# Patient Record
Sex: Female | Born: 1950 | Race: Black or African American | Hispanic: No | Smoking: Current every day smoker
Health system: Southern US, Community
[De-identification: ages and names within clinical notes are randomized; demographics above are authoritative.]

## PROBLEM LIST (undated history)

## (undated) DIAGNOSIS — E119 Type 2 diabetes mellitus without complications: Secondary | ICD-10-CM

## (undated) DIAGNOSIS — I10 Essential (primary) hypertension: Secondary | ICD-10-CM

---

## 2016-05-05 ENCOUNTER — Encounter (HOSPITAL_COMMUNITY): Payer: Self-pay

## 2016-05-05 ENCOUNTER — Observation Stay (HOSPITAL_COMMUNITY)
Admission: EM | Admit: 2016-05-05 | Discharge: 2016-05-06 | Disposition: A | Discharge status: Home / Self Care | Payer: Medicare Other | Attending: Internal Medicine | Admitting: Internal Medicine

## 2016-05-05 ENCOUNTER — Emergency Department (HOSPITAL_COMMUNITY): Payer: Medicare Other

## 2016-05-05 DIAGNOSIS — E119 Type 2 diabetes mellitus without complications: Secondary | ICD-10-CM | POA: Insufficient documentation

## 2016-05-05 DIAGNOSIS — R55 Syncope and collapse: Secondary | ICD-10-CM | POA: Diagnosis not present

## 2016-05-05 DIAGNOSIS — I10 Essential (primary) hypertension: Secondary | ICD-10-CM | POA: Diagnosis not present

## 2016-05-05 DIAGNOSIS — I110 Hypertensive heart disease with heart failure: Secondary | ICD-10-CM | POA: Insufficient documentation

## 2016-05-05 DIAGNOSIS — D649 Anemia, unspecified: Secondary | ICD-10-CM | POA: Diagnosis not present

## 2016-05-05 DIAGNOSIS — I5032 Chronic diastolic (congestive) heart failure: Secondary | ICD-10-CM | POA: Diagnosis not present

## 2016-05-05 DIAGNOSIS — Z79899 Other long term (current) drug therapy: Secondary | ICD-10-CM | POA: Diagnosis not present

## 2016-05-05 DIAGNOSIS — F17210 Nicotine dependence, cigarettes, uncomplicated: Secondary | ICD-10-CM | POA: Insufficient documentation

## 2016-05-05 DIAGNOSIS — I951 Orthostatic hypotension: Principal | ICD-10-CM | POA: Insufficient documentation

## 2016-05-05 DIAGNOSIS — Z794 Long term (current) use of insulin: Secondary | ICD-10-CM | POA: Insufficient documentation

## 2016-05-05 HISTORY — DX: Type 2 diabetes mellitus without complications: E11.9

## 2016-05-05 HISTORY — DX: Essential (primary) hypertension: I10

## 2016-05-05 LAB — URINALYSIS, ROUTINE W REFLEX MICROSCOPIC
Bilirubin Urine: NEGATIVE
Glucose, UA: >=500 mg/dL — AB
HGB URINE DIPSTICK: NEGATIVE
Ketones, ur: NEGATIVE mg/dL
Nitrite: NEGATIVE
Protein, ur: NEGATIVE mg/dL
SPECIFIC GRAVITY, URINE: 1.015 (ref 1.005–1.030)
pH: 6 (ref 5.0–8.0)

## 2016-05-05 LAB — COMPREHENSIVE METABOLIC PANEL
ALBUMIN: 3.7 g/dL (ref 3.5–5.0)
ALK PHOS: 61 U/L (ref 38–126)
ALT: 17 U/L (ref 14–54)
AST: 20 U/L (ref 15–41)
Anion gap: 11 (ref 5–15)
BUN: 18 mg/dL (ref 6–20)
CHLORIDE: 101 mmol/L (ref 101–111)
CO2: 26 mmol/L (ref 22–32)
CREATININE: 0.96 mg/dL (ref 0.44–1.00)
Calcium: 9.2 mg/dL (ref 8.9–10.3)
GFR calc non Af Amer: >60 mL/min (ref >60)
GLUCOSE: 184 mg/dL — AB (ref 65–99)
Potassium: 3.6 mmol/L (ref 3.5–5.1)
SODIUM: 138 mmol/L (ref 135–145)
Total Bilirubin: 0.5 mg/dL (ref 0.3–1.2)
Total Protein: 6.6 g/dL (ref 6.5–8.1)

## 2016-05-05 LAB — CBC WITH DIFFERENTIAL/PLATELET
BASOS ABS: 0 10*3/uL (ref 0.0–0.1)
Basophils Relative: 0 %
EOS ABS: 0.1 10*3/uL (ref 0.0–0.7)
EOS PCT: 1 %
HCT: 34.8 % — ABNORMAL LOW (ref 36.0–46.0)
Hemoglobin: 11.5 g/dL — ABNORMAL LOW (ref 12.0–15.0)
Lymphocytes Relative: 25 %
Lymphs Abs: 2.1 10*3/uL (ref 0.7–4.0)
MCH: 27.2 pg (ref 26.0–34.0)
MCHC: 33 g/dL (ref 30.0–36.0)
MCV: 82.3 fL (ref 78.0–100.0)
MONO ABS: 0.3 10*3/uL (ref 0.1–1.0)
Monocytes Relative: 3 %
Neutro Abs: 6.1 10*3/uL (ref 1.7–7.7)
Neutrophils Relative %: 71 %
PLATELETS: 310 10*3/uL (ref 150–400)
RBC: 4.23 MIL/uL (ref 3.87–5.11)
RDW: 14.7 % (ref 11.5–15.5)
WBC: 8.6 10*3/uL (ref 4.0–10.5)

## 2016-05-05 LAB — GLUCOSE, CAPILLARY: Glucose-Capillary: 188 mg/dL — ABNORMAL HIGH (ref 65–99)

## 2016-05-05 LAB — I-STAT TROPONIN, ED: TROPONIN I, POC: 0 ng/mL (ref 0.00–0.08)

## 2016-05-05 MED ORDER — ACETAMINOPHEN 325 MG PO TABS
650.0000 mg | ORAL_TABLET | Freq: Four times a day (QID) | ORAL | Status: DC | PRN
  Administered 2016-05-05 – 2016-05-06 (×2): 650 mg via ORAL
  Filled 2016-05-05 (×2): qty 2

## 2016-05-05 MED ORDER — ONDANSETRON HCL 4 MG/2ML IJ SOLN: 4.0000 mg | Freq: Four times a day (QID) | INTRAMUSCULAR | Status: DC | PRN

## 2016-05-05 MED ORDER — ENOXAPARIN SODIUM 40 MG/0.4ML ~~LOC~~ SOLN
40.0000 mg | SUBCUTANEOUS | Status: DC
  Administered 2016-05-05: 40 mg via SUBCUTANEOUS
  Filled 2016-05-05 (×2): qty 0.4

## 2016-05-05 MED ORDER — SODIUM CHLORIDE 0.9% FLUSH
3.0000 mL | Freq: Two times a day (BID) | INTRAVENOUS | Status: DC
  Administered 2016-05-05: 3 mL via INTRAVENOUS

## 2016-05-05 MED ORDER — ONDANSETRON HCL 4 MG PO TABS
4.0000 mg | ORAL_TABLET | Freq: Four times a day (QID) | ORAL | Status: DC | PRN
Start: 2016-05-05 — End: 2016-05-06

## 2016-05-05 MED ORDER — INSULIN ASPART 100 UNIT/ML ~~LOC~~ SOLN
0.0000 [IU] | Freq: Three times a day (TID) | SUBCUTANEOUS | Status: DC
  Administered 2016-05-06: 2 [IU] via SUBCUTANEOUS
  Administered 2016-05-06: 3 [IU] via SUBCUTANEOUS
  Administered 2016-05-06: 2 [IU] via SUBCUTANEOUS

## 2016-05-05 MED ORDER — LORATADINE 10 MG PO TABS: 10.0000 mg | ORAL_TABLET | Freq: Every day | ORAL | Status: DC | PRN

## 2016-05-05 MED ORDER — POLYETHYLENE GLYCOL 3350 17 G PO PACK: 17.0000 g | PACK | Freq: Every day | ORAL | Status: DC | PRN

## 2016-05-05 MED ORDER — IBUPROFEN 200 MG PO TABS
600.0000 mg | ORAL_TABLET | Freq: Four times a day (QID) | ORAL | Status: DC | PRN
Start: 2016-05-05 — End: 2016-05-06
  Administered 2016-05-06: 600 mg via ORAL
  Filled 2016-05-05: qty 3

## 2016-05-05 MED ORDER — SODIUM CHLORIDE 0.9% FLUSH: 3.0000 mL | INTRAVENOUS | Status: DC | PRN

## 2016-05-05 MED ORDER — SODIUM CHLORIDE 0.9% FLUSH: 3.0000 mL | Freq: Two times a day (BID) | INTRAVENOUS | Status: DC

## 2016-05-05 MED ORDER — INSULIN ASPART 100 UNIT/ML ~~LOC~~ SOLN: 0.0000 [IU] | Freq: Every day | SUBCUTANEOUS | Status: DC

## 2016-05-05 MED ORDER — SODIUM CHLORIDE 0.9 % IV SOLN: 250.0000 mL | INTRAVENOUS | Status: DC | PRN

## 2016-05-05 MED ORDER — DIPHENHYDRAMINE HCL 25 MG PO CAPS: 25.0000 mg | ORAL_CAPSULE | Freq: Every evening | ORAL | Status: DC | PRN

## 2016-05-05 MED ORDER — SODIUM CHLORIDE 0.9 % IV BOLUS (SEPSIS)
1000.0000 mL | Freq: Once | INTRAVENOUS | Status: AC
  Administered 2016-05-05: 1000 mL via INTRAVENOUS

## 2016-05-05 MED ORDER — ACETAMINOPHEN 650 MG RE SUPP: 650.0000 mg | Freq: Four times a day (QID) | RECTAL | Status: DC | PRN

## 2016-05-05 NOTE — ED Provider Notes (Addendum)
Patient had10 second syncopal event witnessed by her sister. She is presently asymptomatic. On exam no distress lungs clear auscultation heart regular rate and rhythm no murmurs. Neurologic Glasgow Coma Score 15 moves all extremities well cranial nerves II through XII grossly intact not lightheaded on standing. Dr. Antionette Char consulted and will arrange for overnight stay   Doug Sou, MD 05/05/16 2022    Doug Sou, MD 05/05/16 2100    Doug Sou, MD 05/07/16 [phone]

## 2016-05-05 NOTE — ED Provider Notes (Signed)
[facility]-EMERGENCY DEPT Provider Note   CSN: [id number] Arrival date & time: 05/05/16  1801     History   Chief Complaint Chief Complaint  Patient presents with  . Dizziness  . Loss of Consciousness    HPI Tina Davis is a 66 y.o. female.  HPI She presents today after a Syncopal event at home. According to patient's sister patient was unconscious for approximately 10-15 seconds, during which she did not have any jerking movements but she "spat up."  Patient did not fall, did not strike her head, patient's sister moved patient so that she would "not aspirate."  Patient reported feeling a wave of dizziness before she passed out.  Patient does not have a history of syncopal events.  Right before the syncopal event she was sitting down.  She currently feels "a little off" but does not complain of headache, dizziness, nausea/vomiting.  No chest pain, SOB.  Patient reports she has been eating normally today and is compliant with her medications.    Past Medical History:  Diagnosis Date  . Diabetes mellitus without complication (HCC)   . Hypertension     Patient Active Problem List   Diagnosis Date Noted  . Essential hypertension 05/05/2016  . Insulin-requiring or dependent type II diabetes mellitus (HCC) 05/05/2016  . Syncope 05/05/2016  . Type 2 diabetes mellitus without complication (HCC)     History reviewed. No pertinent surgical history.  OB History    No data available       Home Medications    Prior to Admission medications   Medication Sig Start Date End Date Taking? Authorizing Provider  glipiZIDE (GLUCOTROL XL) 5 MG 24 hr tablet Take 5 mg by mouth daily with breakfast.   Yes Historical Provider, MD  loratadine (CLARITIN) 10 MG tablet Take 10 mg by mouth daily as needed for allergies.   Yes Historical Provider, MD  losartan-hydrochlorothiazide (HYZAAR) 100-12.5 MG tablet Take 1 tablet by mouth daily.   Yes Historical Provider, MD  metFORMIN (GLUCOPHAGE) 500 MG  tablet Take 1,000 mg by mouth 2 (two) times daily with a meal.   Yes Historical Provider, MD  Naproxen Sod-Diphenhydramine (ALEVE PM PO) Take 1 tablet by mouth at bedtime as needed (sleep).   Yes Historical Provider, MD    Family History Family History  Problem Relation Age of Onset  . Hypertension Mother   . Diabetes Father   . Diabetes Sister     Social History Social History  Substance Use Topics  . Smoking status: Current Every Day Smoker    Types: Cigarettes  . Smokeless tobacco: Never Used  . Alcohol use No     Allergies   Patient has no known allergies.   Review of Systems Review of Systems  Constitutional: Negative for chills and fever.  HENT: Negative for ear pain and sore throat.   Eyes: Negative for pain and visual disturbance.  Respiratory: Negative for cough, chest tightness and shortness of breath.   Cardiovascular: Negative for chest pain, palpitations and leg swelling.  Gastrointestinal: Negative for abdominal pain and vomiting.  Genitourinary: Negative for dysuria and hematuria.  Musculoskeletal: Negative for arthralgias and back pain.  Skin: Negative for color change and rash.  Neurological: Positive for syncope and light-headedness. Negative for tremors, seizures, facial asymmetry, speech difficulty, weakness and headaches.  All other systems reviewed and are negative.    Physical Exam Updated Vital Signs BP (!) 142/75 (BP Location: Left Arm)   Pulse 85   Temp 98.9 F (37.2  C) (Oral)   Resp 19   Ht  (1.626 m)   Wt 57.2 kg   SpO2 100%   BMI 21.65 kg/m   Physical Exam  Constitutional: She is oriented to person, place, and time. She appears well-developed and well-nourished.  Non-toxic appearance. No distress.  HENT:  Head: Normocephalic and atraumatic.  Right Ear: External ear normal.  Left Ear: External ear normal.  Nose: Nose normal.  Eyes: Conjunctivae and EOM are normal. Pupils are equal, round, and reactive to light. Right eye  exhibits no discharge. Left eye exhibits no discharge. No scleral icterus.  Neck: Normal range of motion. Neck supple. No tracheal deviation present.  Cardiovascular: Normal rate, regular rhythm, normal heart sounds and intact distal pulses.  Exam reveals no gallop and no friction rub.   No murmur heard. Pulmonary/Chest: Effort normal and breath sounds normal. No stridor. No respiratory distress. She has no wheezes. She exhibits no tenderness.  Abdominal: Soft. Bowel sounds are normal. She exhibits no distension. There is no tenderness. There is no guarding.  Musculoskeletal: Normal range of motion. She exhibits no edema, tenderness or deformity.  Neurological: She is alert and oriented to person, place, and time. She has normal strength. She displays no tremor. No cranial nerve deficit or sensory deficit. She exhibits normal muscle tone. GCS eye subscore is 4. GCS verbal subscore is 5. GCS motor subscore is 6.  Skin: Skin is warm and dry. She is not diaphoretic.  Psychiatric: She has a normal mood and affect. Her behavior is normal.  Nursing note and vitals reviewed.    ED Treatments / Results  Labs (all labs ordered are listed, but only abnormal results are displayed) Labs Reviewed  CBC WITH DIFFERENTIAL/PLATELET - Abnormal; Notable for the following:       Result Value   Hemoglobin 11.5 (*)    HCT 34.8 (*)    All other components within normal limits  URINALYSIS, ROUTINE W REFLEX MICROSCOPIC - Abnormal; Notable for the following:    Color, Urine AMBER (*)    APPearance CLOUDY (*)    Glucose, UA >=500 (*)    Leukocytes, UA TRACE (*)    Bacteria, UA RARE (*)    Squamous Epithelial / LPF 0-5 (*)    All other components within normal limits  COMPREHENSIVE METABOLIC PANEL - Abnormal; Notable for the following:    Glucose, Bld 184 (*)    All other components within normal limits  BASIC METABOLIC PANEL - Abnormal; Notable for the following:    Glucose, Bld 101 (*)    Calcium 8.5 (*)      All other components within normal limits  GLUCOSE, CAPILLARY - Abnormal; Notable for the following:    Glucose-Capillary 188 (*)    All other components within normal limits  GLUCOSE, CAPILLARY - Abnormal; Notable for the following:    Glucose-Capillary 124 (*)    All other components within normal limits  GLUCOSE, CAPILLARY - Abnormal; Notable for the following:    Glucose-Capillary 162 (*)    All other components within normal limits  HEMOGLOBIN A1C  I-STAT TROPOININ, ED    EKG  EKG Interpretation  Date/Time:  Tuesday May 05 2016 18:22:16 EDT Ventricular Rate:  84 PR Interval:    QRS Duration: 102 QT Interval:  387 QTC Calculation: 458 R Axis:   69 Text Interpretation:  Sinus rhythm Probable left atrial enlargement No old tracing to compare Confirmed by Ethelda Chick  MD, SAM [id number]) on 05/05/2016 6:31:24 PM  Radiology Dg Chest 2 View  Result Date: 05/05/2016 CLINICAL DATA:  Syncope.  Vomiting. EXAM: CHEST  2 VIEW COMPARISON:  None. FINDINGS: Lungs are adequately inflated without consolidation or effusion. Cardiomediastinal silhouette is within normal. Mild degenerate change of the spine. IMPRESSION: No active cardiopulmonary disease. Electronically Signed   By: Elberta Fortis M.D.   On: 05/05/2016 20:18    Procedures Procedures (including critical care time)  Medications Ordered in ED    Initial Impression / Assessment and Plan / ED Course  I have reviewed the triage vital signs and the nursing notes.  Pertinent labs & imaging results that were available during my care of the patient were reviewed by me and considered in my medical decision making (see chart for details).    Patient without arrhythmia or tachycardia while here in the department.  Patient without history of congestive heart failure, normal ECG, no shortness of breath and systolic blood pressure greater than 90.    At shift change care was transferred to Dr. Ethelda Chick who will follow pending  studies, re-evaulate and determine disposition.      Final Clinical Impressions(s) / ED Diagnoses   Final diagnoses:  Syncope and collapse    New Prescriptions Current Discharge Medication List       Cristina Gong, Cordelia Poche 05/06/16 1234    Doug Sou, MD 05/07/16 [phone]

## 2016-05-05 NOTE — ED Notes (Signed)
Opyd, MD at bedside 

## 2016-05-05 NOTE — ED Notes (Signed)
Pt called out. C/o cramps.

## 2016-05-05 NOTE — H&P (Signed)
History and Physical    Tina Davis ZOX:[id number] DOB: 17-Jun-1950 DOA: 05/05/2016  PCP: No PCP Per Patient   Patient coming from: Home  Chief Complaint: Syncope   HPI: Tina Davis is a 66 y.o. female with medical history significant for hypertension and type 2 diabetes mellitus who presents the emergency department following a syncopal episode at home. Patient reports that she was in her usual state of health and having an uneventful day. She had been out shopping with her sisters and in her normal state after returning home. She was in a chair, conversing with her sisters when she experienced a sudden "wave of nausea and lightheadedness," which was followed by an apparent loss of consciousness. She was noted by her sisters to slump over in the chair and vomit. They turned her to her side to avoid aspiration and reports that she regained consciousness within a few seconds. She reports some nausea and mild malaise after regaining consciousness, but denies any chest pain or palpitations and denies any headache, change in vision or hearing, or focal numbness or weakness. She had never experienced anything similar. She denies any lower extremity swelling or tenderness, prolonged immobilization, or personal or family history of VTE. There has not been any recent fevers or chills and no dysuria or flank pain. No recent abdominal pain or diarrhea.  ED Course: Upon arrival to the ED, patient is found to be saturating well on room air, initial blood pressure 95/69, and vitals otherwise stable. EKG features a normal sinus rhythm and chest x-ray is negative for acute cardiopulmonary disease. Chemistry panel was unremarkable and CBC is notable for a mild normocytic anemia with hemoglobin of 11.5. Troponin is undetectable. Urinalysis has been ordered and remains pending and the patient was given 1 L of normal saline in the emergency department. Blood pressure came up some and has remained stable. The  patient has not been in any apparent distress in the ED and she will be observed on the telemetry unit for ongoing evaluation and management of a syncopal episode.   Review of Systems:  All other systems reviewed and apart from HPI, are negative.  Past Medical History:  Diagnosis Date  . Diabetes mellitus without complication (HCC)   . Hypertension     History reviewed. No pertinent surgical history.   reports that she has been smoking.  She has never used smokeless tobacco. She reports that she does not drink alcohol or use drugs.  No Known Allergies  History reviewed. No pertinent family history.   Prior to Admission medications   Medication Sig Start Date End Date Taking? Authorizing Provider  loratadine (CLARITIN) 10 MG tablet Take 10 mg by mouth daily as needed for allergies.   Yes Historical Provider, MD  metFORMIN (GLUCOPHAGE) 500 MG tablet Take 1,000 mg by mouth 2 (two) times daily with a meal.   Yes Historical Provider, MD  Naproxen Sod-Diphenhydramine (ALEVE PM PO) Take 1 tablet by mouth at bedtime as needed (sleep).   Yes Historical Provider, MD    Physical Exam: Vitals:   05/05/16 1821 05/05/16 1825 05/05/16 2000 05/05/16 2045  BP:   119/71 123/81  Pulse: 86  82 83  Resp: SpO2: 100%  100% 100%  Weight:  57.6 kg (127 lb)    Height:   (1.626 m)        Constitutional: NAD, calm, comfortable Eyes: PERTLA, lids and conjunctivae normal ENMT: Mucous membranes are moist. Posterior pharynx clear of any  exudate or lesions.   Neck: normal, supple, no masses, no thyromegaly Respiratory: clear to auscultation bilaterally, no wheezing, no crackles. Normal respiratory effort.   Cardiovascular: S1 & S2 heard, regular rate and rhythm. No extremity edema. 2+ pedal pulses. No significant JVD. Abdomen: No distension, no tenderness, no masses palpated. Bowel sounds normal.  Musculoskeletal: no clubbing / cyanosis. No joint deformity upper and lower extremities.     Skin: no significant rashes, lesions, ulcers. Warm, dry, well-perfused. Neurologic: CN 2-12 grossly intact. Sensation intact, DTR normal. Strength 5/5 in all 4 limbs.  Psychiatric: Alert and oriented x 3. Normal mood and affect.     Labs on Admission: I have personally reviewed following labs and imaging studies  CBC:  Recent Labs Lab 05/05/16 1931  WBC 8.6  NEUTROABS 6.1  HGB 11.5*  HCT 34.8*  MCV 82.3  PLT 310   Basic Metabolic Panel:  Recent Labs Lab 05/05/16 1931  NA 138  K 3.6  CL 101  CO2 26  GLUCOSE 184*  BUN 18  CREATININE 0.96  CALCIUM 9.2   GFR: Estimated Creatinine Clearance: 49.8 mL/min (by C-G formula based on SCr of 0.96 mg/dL). Liver Function Tests:  Recent Labs Lab 05/05/16 1931  AST 20  ALT 17  ALKPHOS 61  BILITOT 0.5  PROT 6.6  ALBUMIN 3.7   No results for input(s): LIPASE, AMYLASE in the last 168 hours. No results for input(s): AMMONIA in the last 168 hours. Coagulation Profile: No results for input(s): INR, PROTIME in the last 168 hours. Cardiac Enzymes: No results for input(s): CKTOTAL, CKMB, CKMBINDEX, TROPONINI in the last 168 hours. BNP (last 3 results) No results for input(s): PROBNP in the last 8760 hours. HbA1C: No results for input(s): HGBA1C in the last 72 hours. CBG: No results for input(s): GLUCAP in the last 168 hours. Lipid Profile: No results for input(s): CHOL, HDL, LDLCALC, TRIG, CHOLHDL, LDLDIRECT in the last 72 hours. Thyroid Function Tests: No results for input(s): TSH, T4TOTAL, FREET4, T3FREE, THYROIDAB in the last 72 hours. Anemia Panel: No results for input(s): VITAMINB12, FOLATE, FERRITIN, TIBC, IRON, RETICCTPCT in the last 72 hours. Urine analysis: No results found for: COLORURINE, APPEARANCEUR, LABSPEC, PHURINE, GLUCOSEU, HGBUR, BILIRUBINUR, KETONESUR, PROTEINUR, UROBILINOGEN, NITRITE, LEUKOCYTESUR Sepsis Labs: (procalcitonin:4,lacticidven:4) )No results found for this or any previous visit  (from the past 240 hour(s)).   Radiological Exams on Admission: Dg Chest 2 View  Result Date: 05/05/2016 CLINICAL DATA:  Syncope.  Vomiting. EXAM: CHEST  2 VIEW COMPARISON:  None. FINDINGS: Lungs are adequately inflated without consolidation or effusion. Cardiomediastinal silhouette is within normal. Mild degenerate change of the spine. IMPRESSION: No active cardiopulmonary disease. Electronically Signed   By: Elberta Fortis M.D.   On: 05/05/2016 20:18    EKG: Independently reviewed. Normal sinus rhythm.   Assessment/Plan  1. Syncope  - Pt presents after first syncopal episode, occurred while seated with prodromal nausea and lightheadedness, followed immediately by vomiting  - Initial blood pressure soft, but remaining ED workup unremarkable - Suspect a neurally-mediated reflex syncope based on prodrome  - Nothing to suggest seizure or PE - Check orthostatic vitals, monitor on telemetry for transient arrhythmia, exclude structural cardiac etiology with echo   2. Hypertension  - Initial BP soft in ED, at goal and stable since  - Managed at home with losartan-HCTZ, will hold for now - Trend BP, consider discontinuation or dose-reduction   3. Type II DM  - A1c was 10.4% in March 2015 - Managed at home  with metformin and glipizide; currently held  - Check CBG with meals and qHS  - Start a moderate-intensity sliding-scale Novolog correctional    DVT prophylaxis: sq Lovenox  Code Status: Full  Family Communication: Discussed with patient Disposition Plan: Observe on telemetry Consults called: None  Admission status: Observation     Briscoe Deutscher, MD [facility] Pager [phone]  If 7PM-7AM, please contact night-coverage [email or web address] Password [facility]  05/05/2016, 9:34 PM

## 2016-05-05 NOTE — ED Triage Notes (Signed)
Pt brought in by EMS due to having dizziness and syncopal episode. Per EMS pt LOC for approximately 10 seconds and had an episode of vomiting. Foaming at the mouth. Pt a&ox4. Pt received 350cc of NS.

## 2016-05-05 NOTE — ED Notes (Signed)
Patient transported to X-ray 

## 2016-05-06 ENCOUNTER — Observation Stay (HOSPITAL_BASED_OUTPATIENT_CLINIC_OR_DEPARTMENT_OTHER): Payer: Medicare Other

## 2016-05-06 DIAGNOSIS — I10 Essential (primary) hypertension: Secondary | ICD-10-CM

## 2016-05-06 DIAGNOSIS — R55 Syncope and collapse: Secondary | ICD-10-CM

## 2016-05-06 DIAGNOSIS — Z794 Long term (current) use of insulin: Secondary | ICD-10-CM | POA: Diagnosis not present

## 2016-05-06 DIAGNOSIS — E119 Type 2 diabetes mellitus without complications: Secondary | ICD-10-CM | POA: Diagnosis not present

## 2016-05-06 LAB — BASIC METABOLIC PANEL WITH GFR
Anion gap: 6 (ref 5–15)
BUN: 19 mg/dL (ref 6–20)
CO2: 24 mmol/L (ref 22–32)
Calcium: 8.5 mg/dL — ABNORMAL LOW (ref 8.9–10.3)
Chloride: 107 mmol/L (ref 101–111)
Creatinine, Ser: 0.86 mg/dL (ref 0.44–1.00)
GFR calc Af Amer: >60 mL/min
GFR calc non Af Amer: >60 mL/min
Glucose, Bld: 101 mg/dL — ABNORMAL HIGH (ref 65–99)
Potassium: 3.6 mmol/L (ref 3.5–5.1)
Sodium: 137 mmol/L (ref 135–145)

## 2016-05-06 LAB — GLUCOSE, CAPILLARY
GLUCOSE-CAPILLARY: 147 mg/dL — AB (ref 65–99)
Glucose-Capillary: 124 mg/dL — ABNORMAL HIGH (ref 65–99)
Glucose-Capillary: 162 mg/dL — ABNORMAL HIGH (ref 65–99)

## 2016-05-06 LAB — HEMOGLOBIN A1C
HEMOGLOBIN A1C: 9.9 % — AB (ref 4.8–5.6)
MEAN PLASMA GLUCOSE: 237 mg/dL

## 2016-05-06 LAB — ECHOCARDIOGRAM COMPLETE
HEIGHTINCHES: 64 in
Weight: 2018 oz

## 2016-05-06 MED ORDER — IBUPROFEN 600 MG PO TABS: 600.0000 mg | ORAL_TABLET | Freq: Four times a day (QID) | ORAL | 0 refills | Status: DC | PRN

## 2016-05-06 NOTE — Care Management Note (Signed)
Case Management Note  Patient Details  Name: Tina Davis MRN: [id number] Date of Birth: 1950-05-05  Subjective/Objective:   Pt in with syncope. 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 She is IADL.                   Action/Plan: Plan is for patient to return home at d/c. CM consulted for assistance obtaining a PCP. Pt provided with the [facility] number. CM also provided her with 3 number for physicians taking new patients near the [place] area. Pt to call and arrange a first appointment.   Expected Discharge Date:                  Expected Discharge Plan:  Home/Self Care  In-House Referral:     Discharge planning Services  CM Consult  Post Acute Care Choice:    Choice offered to:     DME Arranged:    DME Agency:     HH Arranged:    HH Agency:     Status of Service:  In process, will continue to follow  If discussed at Long Length of Stay Meetings, dates discussed:    Additional Comments:  Kermit Balo, RN 05/06/2016, 12:07 PM

## 2016-05-06 NOTE — Care Management Obs Status (Signed)
MEDICARE OBSERVATION STATUS NOTIFICATION   Patient Details  Name: Tina Davis MRN: [id number] Date of Birth: 1950-11-03   Medicare Observation Status Notification Given:  Yes    Kermit Balo, RN 05/06/2016, 11:37 AM

## 2016-05-06 NOTE — Progress Notes (Signed)
  Echocardiogram 2D Echocardiogram has been performed.  Tina Davis 05/06/2016, 10:29 AM

## 2016-05-06 NOTE — Progress Notes (Signed)
Patient arrived to [facility] from ED. Patient is alert and oriented x 4. Tele placed and verified. Patient oriented to room, call light and environment. Patient denies pain and discomfort. Will continue to monitor and treat.

## 2016-05-06 NOTE — Care Management Note (Signed)
Case Management Note  Patient Details  Name: Tina Davis MRN: [id number] Date of Birth: 1950/03/26  Subjective/Objective:                    Action/Plan: Pt discharging home with self care. No further needs per CM.   Expected Discharge Date:  05/06/16               Expected Discharge Plan:  Home/Self Care  In-House Referral:     Discharge planning Services  CM Consult  Post Acute Care Choice:    Choice offered to:     DME Arranged:    DME Agency:     HH Arranged:    HH Agency:     Status of Service:  Completed, signed off  If discussed at [employer] of Stay Meetings, dates discussed:    Additional Comments:  Kermit Balo, RN 05/06/2016, 2:39 PM

## 2016-05-06 NOTE — Discharge Summary (Signed)
Physician Discharge Summary  Cornell Gaber ZOX:[id number] DOB: 1950/11/13 DOA: 05/05/2016  PCP: No PCP Per Patient  Admit date: 05/05/2016 Discharge date: 05/06/2016   Recommendations for Outpatient Follow-Up:   1. Patient to establish with PCP   Discharge Diagnosis:   Principal Problem:   Syncope Active Problems:   Essential hypertension   Insulin-requiring or dependent type II diabetes mellitus (HCC)   Discharge disposition:  Home.  Discharge Condition: Improved.  Diet recommendation: Low sodium, heart healthy.  Carbohydrate-modified  Wound care: None.   History of Present Illness:   Tina Davis is a 66 y.o. female with medical history significant for hypertension and type 2 diabetes mellitus who presents the emergency department following a syncopal episode at home. Patient reports that she was in her usual state of health and having an uneventful day. She had been out shopping with her sisters and in her normal state after returning home. She was in a chair, conversing with her sisters when she experienced a sudden "wave of nausea and lightheadedness," which was followed by an apparent loss of consciousness. She was noted by her sisters to slump over in the chair and vomit. They turned her to her side to avoid aspiration and reports that she regained consciousness within a few seconds. She reports some nausea and mild malaise after regaining consciousness, but denies any chest pain or palpitations and denies any headache, change in vision or hearing, or focal numbness or weakness. She had never experienced anything similar. She denies any lower extremity swelling or tenderness, prolonged immobilization, or personal or family history of VTE. There has not been any recent fevers or chills and no dysuria or flank pain. No recent abdominal pain or diarrhea   Hospital Course by Problem:   Syncope due to orthostatic hypotension -resolved after IVF -echo: Study  Conclusions - Left ventricle: The cavity size was normal. Systolic function was   normal. The estimated ejection fraction was in the range of 60%   to 65%. Wall motion was normal; there were no regional wall   motion abnormalities. Features are consistent with a pseudonormal   left ventricular filling pattern, with concomitant abnormal   relaxation and increased filling pressure (grade 2 diastolic   dysfunction). Doppler parameters are consistent with high   ventricular filling pressure. - Aortic valve: Trileaflet; normal thickness, mildly calcified   leaflets. - Mitral valve: There was trivial regurgitation.  HTN -resume home meds  Chronic diastolic CHF -on HCTZ  Type 2 DM -resume home meds    Medical Consultants:    None.   Discharge Exam:   Vitals:   05/06/16 0512 05/06/16 1142  BP: 134/68 (!) 142/75  Pulse: 88 85  Resp: 16 19  Temp: 97.9 F (36.6 C) 98.9 F (37.2 C)   Vitals:   05/06/16 0042 05/06/16 0500 05/06/16 0512 05/06/16 1142  BP: 106/90  134/68 (!) 142/75  Pulse: 89  88 85  Resp: Temp: 97.9 F (36.6 C)  97.9 F (36.6 C) 98.9 F (37.2 C)  TempSrc: Oral  Oral Oral  SpO2: 100%  100% 100%  Weight:  57.2 kg (126 lb 2 oz)    Height:        Gen:  NAD   The results of significant diagnostics from this hospitalization (including imaging, microbiology, ancillary and laboratory) are listed below for reference.     Procedures and Diagnostic Studies:   Dg Chest 2 View  Result Date: 05/05/2016 CLINICAL DATA:  Syncope.  Vomiting. EXAM: CHEST  2 VIEW COMPARISON:  None. FINDINGS: Lungs are adequately inflated without consolidation or effusion. Cardiomediastinal silhouette is within normal. Mild degenerate change of the spine. IMPRESSION: No active cardiopulmonary disease. Electronically Signed   By: Elberta Fortis M.D.   On: 05/05/2016 20:18     Labs:   Basic Metabolic Panel:  Recent Labs Lab 05/05/16 1931 05/06/16 0455  NA 138 137   K 3.6 3.6  CL 101 107  CO2 26 24  GLUCOSE 184* 101*  BUN 18 19  CREATININE 0.96 0.86  CALCIUM 9.2 8.5*   GFR Estimated Creatinine Clearance: 55.6 mL/min (by C-G formula based on SCr of 0.86 mg/dL). Liver Function Tests:  Recent Labs Lab 05/05/16 1931  AST 20  ALT 17  ALKPHOS 61  BILITOT 0.5  PROT 6.6  ALBUMIN 3.7   No results for input(s): LIPASE, AMYLASE in the last 168 hours. No results for input(s): AMMONIA in the last 168 hours. Coagulation profile No results for input(s): INR, PROTIME in the last 168 hours.  CBC:  Recent Labs Lab 05/05/16 1931  WBC 8.6  NEUTROABS 6.1  HGB 11.5*  HCT 34.8*  MCV 82.3  PLT 310   Cardiac Enzymes: No results for input(s): CKTOTAL, CKMB, CKMBINDEX, TROPONINI in the last 168 hours. BNP: Invalid input(s): POCBNP CBG:  Recent Labs Lab 05/05/16 2348 05/06/16 0636 05/06/16 1140  GLUCAP 188* 124* 162*   D-Dimer No results for input(s): DDIMER in the last 72 hours. Hgb A1c No results for input(s): HGBA1C in the last 72 hours. Lipid Profile No results for input(s): CHOL, HDL, LDLCALC, TRIG, CHOLHDL, LDLDIRECT in the last 72 hours. Thyroid function studies No results for input(s): TSH, T4TOTAL, T3FREE, THYROIDAB in the last 72 hours.  Invalid input(s): FREET3 Anemia work up No results for input(s): VITAMINB12, FOLATE, FERRITIN, TIBC, IRON, RETICCTPCT in the last 72 hours. Microbiology No results found for this or any previous visit (from the past 240 hour(s)).   Discharge Instructions:   Discharge Instructions    Diet - low sodium heart healthy    Complete by:  As directed    Diet Carb Modified    Complete by:  As directed    Discharge instructions    Complete by:  As directed    Stay hydrated as part of your BP medication is a diuretic   Increase activity slowly    Complete by:  As directed      Allergies as of 05/06/2016   No Known Allergies     Medication List    TAKE these medications   ALEVE PM  PO Take 1 tablet by mouth at bedtime as needed (sleep).   glipiZIDE 5 MG 24 hr tablet Commonly known as:  GLUCOTROL XL Take 5 mg by mouth daily with breakfast.   ibuprofen 600 MG tablet Commonly known as:  ADVIL,MOTRIN Take 1 tablet (600 mg total) by mouth every 6 (six) hours as needed for moderate pain.   loratadine 10 MG tablet Commonly known as:  CLARITIN Take 10 mg by mouth daily as needed for allergies.   losartan-hydrochlorothiazide 100-12.5 MG tablet Commonly known as:  HYZAAR Take 1 tablet by mouth daily.   metFORMIN 500 MG tablet Commonly known as:  GLUCOPHAGE Take 1,000 mg by mouth 2 (two) times daily with a meal.      Follow-up Information    establish with PCP Follow up.            Time coordinating discharge: 25 min  Signed:  Tarshia Kot Juanetta Gosling   [facility] 05/06/2016, 1:57 PM

## 2016-06-22 ENCOUNTER — Emergency Department (HOSPITAL_COMMUNITY): Payer: Medicare PPO

## 2016-06-22 ENCOUNTER — Encounter (HOSPITAL_COMMUNITY): Payer: Self-pay | Admitting: Emergency Medicine

## 2016-06-22 ENCOUNTER — Emergency Department (HOSPITAL_COMMUNITY)
Admission: EM | Admit: 2016-06-22 | Discharge: 2016-06-22 | Disposition: A | Discharge status: Home / Self Care | Payer: Medicare PPO | Attending: Emergency Medicine | Admitting: Emergency Medicine

## 2016-06-22 DIAGNOSIS — R55 Syncope and collapse: Secondary | ICD-10-CM | POA: Diagnosis present

## 2016-06-22 DIAGNOSIS — F17210 Nicotine dependence, cigarettes, uncomplicated: Secondary | ICD-10-CM | POA: Diagnosis not present

## 2016-06-22 DIAGNOSIS — Z79899 Other long term (current) drug therapy: Secondary | ICD-10-CM | POA: Diagnosis not present

## 2016-06-22 DIAGNOSIS — Z7984 Long term (current) use of oral hypoglycemic drugs: Secondary | ICD-10-CM | POA: Diagnosis not present

## 2016-06-22 DIAGNOSIS — E119 Type 2 diabetes mellitus without complications: Secondary | ICD-10-CM | POA: Diagnosis not present

## 2016-06-22 DIAGNOSIS — I10 Essential (primary) hypertension: Secondary | ICD-10-CM | POA: Diagnosis not present

## 2016-06-22 LAB — URINALYSIS, ROUTINE W REFLEX MICROSCOPIC
BILIRUBIN URINE: NEGATIVE
Glucose, UA: NEGATIVE mg/dL
HGB URINE DIPSTICK: NEGATIVE
KETONES UR: NEGATIVE mg/dL
NITRITE: NEGATIVE
Protein, ur: NEGATIVE mg/dL
Specific Gravity, Urine: 1.012 (ref 1.005–1.030)
pH: 5 (ref 5.0–8.0)

## 2016-06-22 LAB — CBC
HEMATOCRIT: 31.3 % — AB (ref 36.0–46.0)
HEMOGLOBIN: 10.2 g/dL — AB (ref 12.0–15.0)
MCH: 26.8 pg (ref 26.0–34.0)
MCHC: 32.6 g/dL (ref 30.0–36.0)
MCV: 82.4 fL (ref 78.0–100.0)
Platelets: 288 10*3/uL (ref 150–400)
RBC: 3.8 MIL/uL — AB (ref 3.87–5.11)
RDW: 14.1 % (ref 11.5–15.5)
WBC: 6.3 10*3/uL (ref 4.0–10.5)

## 2016-06-22 LAB — BASIC METABOLIC PANEL
ANION GAP: 13 (ref 5–15)
BUN: 30 mg/dL — ABNORMAL HIGH (ref 6–20)
CO2: 20 mmol/L — AB (ref 22–32)
Calcium: 8.4 mg/dL — ABNORMAL LOW (ref 8.9–10.3)
Chloride: 104 mmol/L (ref 101–111)
Creatinine, Ser: 1.17 mg/dL — ABNORMAL HIGH (ref 0.44–1.00)
GFR calc Af Amer: 55 mL/min — ABNORMAL LOW (ref >60)
GFR, EST NON AFRICAN AMERICAN: 47 mL/min — AB (ref >60)
GLUCOSE: 179 mg/dL — AB (ref 65–99)
POTASSIUM: 3.7 mmol/L (ref 3.5–5.1)
Sodium: 137 mmol/L (ref 135–145)

## 2016-06-22 LAB — CBG MONITORING, ED: GLUCOSE-CAPILLARY: 177 mg/dL — AB (ref 65–99)

## 2016-06-22 LAB — I-STAT TROPONIN, ED
Troponin i, poc: 0 ng/mL (ref 0.00–0.08)
Troponin i, poc: 0 ng/mL (ref 0.00–0.08)

## 2016-06-22 MED ORDER — CEPHALEXIN 500 MG PO CAPS: 500.0000 mg | ORAL_CAPSULE | Freq: Two times a day (BID) | ORAL | 0 refills | Status: AC

## 2016-06-22 MED ORDER — SODIUM CHLORIDE 0.9 % IV BOLUS (SEPSIS)
1000.0000 mL | Freq: Once | INTRAVENOUS | Status: AC
  Administered 2016-06-22: 1000 mL via INTRAVENOUS

## 2016-06-22 NOTE — ED Notes (Signed)
Social Work and Case Management at bedside

## 2016-06-22 NOTE — ED Notes (Signed)
AC at bedside with case manager and patient

## 2016-06-22 NOTE — Care Management (Addendum)
ED CM and CSW met with patient at bedside, CM reviewed patient's record, and explained that patient does not meet a criteria for admission based on Medicare guidelines. Patient's V/S WNL no c/o CP, lightheadedness , or pain.  CM spent time reviewing  the findings from and comparing Medicare standards.  Patient verbalized understanding and agreeableteach back done. Patient admits to not check CBG's and not hydrating enough throughout the day.  CM Discussed the importance of monitoring her CBG more frequently and staying hydrated.  Patient advised to call PCP tomorrow to schedule a follow up appointment.  Patient verbalized understanding and appreciation for the assistance. Patient stated she would need assistance to get home. Because no one from the family can pick patient up. CSW provided taxi voucher.  CM requested by [facility] Crystal  to speak with patient's sister Seth Bake to inform her on discharge plan.  Her sister was very cordial in her response but ended conversation with "we will filing charges". AC Crystal at [facility] and Shanon Brow ED charge nurse and [facility] ED NP all aware.

## 2016-06-22 NOTE — ED Provider Notes (Signed)
[facility]-EMERGENCY DEPT Provider Note   CSN: [id number] Arrival date & time: 06/22/16  1657     History   Chief Complaint Chief Complaint  Patient presents with  . Loss of Consciousness    HPI Tina Davis is a 66 y.o. female presenting with a syncopal episode while sitting down and having a conversation with her sister. She reports similar episodes starting April 17 and then again last Tuesday and again yesterday and today. She describes a prodrome of dizziness and purple spots everywhere in her vision just prior to syncope. She reports associated vomiting while syncopizing. She denies any recent medication changes. She does report current high stress living situation. She was prescribed medicine for stress and anxiety but hasn't taken them because she does not want the side effects. She reports that in between episodes she is feeling her normal self and well. No fever, chills, dysuria or melena, she has been otherwise healthy. Denies history of malignancy or DVT, no recent surgeries or prolonged immobilization, no hemoptysis, no cough, no leg swelling or calf pain.  HPI  Past Medical History:  Diagnosis Date  . Diabetes mellitus without complication (HCC)   . Hypertension     Patient Active Problem List   Diagnosis Date Noted  . Essential hypertension 05/05/2016  . Insulin-requiring or dependent type II diabetes mellitus (HCC) 05/05/2016  . Syncope 05/05/2016  . Type 2 diabetes mellitus without complication (HCC)     History reviewed. No pertinent surgical history.  OB History    No data available       Home Medications    Prior to Admission medications   Medication Sig Start Date End Date Taking? Authorizing Provider  ESTROGENS CONJ SYNTHETIC B PO Take 1 tablet by mouth as needed (for flashes and sleep). Take the pm of this medication   Yes [provider]  glipiZIDE (GLUCOTROL XL) 5 MG 24 hr tablet Take 5 mg by mouth daily with breakfast.   Yes [provider]  losartan-hydrochlorothiazide (HYZAAR) 100-12.5 MG tablet Take 1 tablet by mouth daily.   Yes [provider]  metFORMIN (GLUCOPHAGE) 500 MG tablet Take 1,000 mg by mouth 2 (two) times daily with a meal.   Yes [provider]  Naproxen Sod-Diphenhydramine (ALEVE PM PO) Take 1 tablet by mouth at bedtime as needed (sleep).   Yes [provider]  vitamin B-12 (CYANOCOBALAMIN) 100 MCG tablet Take 1 tablet by mouth daily.   Yes [provider]  cephALEXin (KEFLEX) 500 MG capsule Take 1 capsule (500 mg total) by mouth 2 (two) times daily. 06/22/16 06/29/16  Mathews Robinsons B, PA-C  ibuprofen (ADVIL,MOTRIN) 600 MG tablet Take 1 tablet (600 mg total) by mouth every 6 (six) hours as needed for moderate pain. Patient not taking: Reported on 06/22/2016 05/06/16   Joseph Art, DO  loratadine (CLARITIN) 10 MG tablet Take 10 mg by mouth daily as needed for allergies.    [provider]    Family History Family History  Problem Relation Age of Onset  . Hypertension Mother   . Diabetes Father   . Diabetes Sister     Social History Social History  Substance Use Topics  . Smoking status: Current Every Day Smoker    Types: Cigarettes  . Smokeless tobacco: Never Used  . Alcohol use No     Allergies   Patient has no known allergies.   Review of Systems Review of Systems  Constitutional: Negative for chills and fever.  HENT:  Negative for ear pain and sore throat.   Eyes: Negative for pain and visual disturbance.  Respiratory: Negative for cough, shortness of breath, wheezing and stridor.   Cardiovascular: Negative for chest pain, palpitations and leg swelling.  Gastrointestinal: Positive for nausea and vomiting. Negative for abdominal distention, abdominal pain, blood in stool and diarrhea.  Genitourinary: Negative for difficulty urinating, dysuria, flank pain and hematuria.  Musculoskeletal: Negative for arthralgias, back pain, gait  problem, neck pain and neck stiffness.  Skin: Negative for color change, pallor and rash.  Neurological: Positive for dizziness, syncope and light-headedness. Negative for tremors, seizures, facial asymmetry, speech difficulty, weakness, numbness and headaches.  All other systems reviewed and are negative.    Physical Exam Updated Vital Signs BP 111/77 (BP Location: Right Arm)   Pulse (!) 105   Temp 97.9 F (36.6 C) (Oral)   Resp (!) 22   SpO2 99%   Physical Exam  Constitutional: She is oriented to person, place, and time. She appears well-developed and well-nourished. No distress.  Afebrile, nontoxic-appearing, lying comfortably in bed in no acute distress.  HENT:  Head: Normocephalic and atraumatic.  Mouth/Throat: Oropharynx is clear and moist. No oropharyngeal exudate.  Eyes: Conjunctivae and EOM are normal. Pupils are equal, round, and reactive to light. Right eye exhibits no discharge. Left eye exhibits no discharge.  Neck: Normal range of motion. Neck supple.  Cardiovascular: Normal rate, regular rhythm, normal heart sounds and intact distal pulses.   No murmur heard. Pulmonary/Chest: Effort normal and breath sounds normal. No respiratory distress. She has no wheezes. She has no rales. She exhibits no tenderness.  Abdominal: Soft. She exhibits no distension and no mass. There is no tenderness. There is no guarding.  Musculoskeletal: She exhibits no edema.  Neurological: She is alert and oriented to person, place, and time. No cranial nerve deficit or sensory deficit. She exhibits normal muscle tone. Coordination normal.  Neurologic Exam: - Mental status: Patient is alert and cooperative. Fluent speech and words are clear. Coherent thought processes and insight is good. Patient is oriented x 4 to person, place, time and event.  - Cranial nerves:  CN III, IV, VI: pupils equally round, reactive to light both direct and conscensual and normal accommodation. Full extra-ocular  movement. CN V: motor temporalis and masseter strength intact. CN VII : muscles of facial expression intact. CN X :  midline uvula. XI strength of sternocleidomastoid and trapezius muscles 5/5, XII: tongue is midline when protruded. - Motor: No involuntary movements. Muscle tone and bulk normal throughout. Muscle strength is 5/5 in bilateral shoulder abduction, elbow flexion and extension, grip, hip extension, flexion, leg flexion and extension, ankle dorsiflexion and plantar flexion.  - Sensory: Proprioception, light tough sensation intact in all extremities.  - Cerebellar: rapid alternating movements and point to point movement intact in upper and lower extremities. Normal stance and gait.  Skin: Skin is warm and dry. No rash noted. She is not diaphoretic. No erythema. No pallor.  Psychiatric: She has a normal mood and affect.  Nursing note and vitals reviewed.    ED Treatments / Results  Labs (all labs ordered are listed, but only abnormal results are displayed) Labs Reviewed  BASIC METABOLIC PANEL - Abnormal; Notable for the following:       Result Value   CO2 20 (*)    Glucose, Bld 179 (*)    BUN 30 (*)    Creatinine, Ser 1.17 (*)    Calcium 8.4 (*)    GFR  calc non Af Amer 47 (*)    GFR calc Af Amer 55 (*)    All other components within normal limits  CBC - Abnormal; Notable for the following:    RBC 3.80 (*)    Hemoglobin 10.2 (*)    HCT 31.3 (*)    All other components within normal limits  URINALYSIS, ROUTINE W REFLEX MICROSCOPIC - Abnormal; Notable for the following:    Color, Urine YELLOW (*)    APPearance CLOUDY (*)    Leukocytes, UA LARGE (*)    Bacteria, UA MANY (*)    Squamous Epithelial / LPF 6-30 (*)    All other components within normal limits  CBG MONITORING, ED - Abnormal; Notable for the following:    Glucose-Capillary 177 (*)    All other components within normal limits  I-STAT TROPOININ, ED  I-STAT TROPOININ, ED    EKG  EKG  Interpretation  Date/Time:  Monday June 22 2016 17:05:37 EDT Ventricular Rate:  116 PR Interval:    QRS Duration: 75 QT Interval:  323 QTC Calculation: 449 R Axis:   78 Text Interpretation:  Sinus tachycardia Consider left ventricular hypertrophy No significant change since last tracing Confirmed by Rubin Payor  MD, NATHAN [phone]) on 06/22/2016 8:47:46 PM       Radiology Ct Head Wo Contrast  Result Date: 06/22/2016 CLINICAL DATA:  Syncope, dizziness, vomiting. EXAM: CT HEAD WITHOUT CONTRAST TECHNIQUE: Contiguous axial images were obtained from the base of the skull through the vertex without intravenous contrast. COMPARISON:  None. FINDINGS: Brain: No evidence of acute infarction, hemorrhage, hydrocephalus, extra-axial collection or mass lesion/mass effect. Mild brain parenchymal volume loss and periventricular microangiopathy. Vascular: Calcific atherosclerotic disease at the skullbase. Skull: Normal. Negative for fracture or focal lesion. Sinuses/Orbits: No acute finding. Other: None. IMPRESSION: No acute intracranial abnormality. Mild parenchymal atrophy and chronic microvascular disease. Electronically Signed   By: Ted Mcalpine M.D.   On: 06/22/2016 21:02    Procedures Procedures (including critical care time)  Medications Ordered in ED Medications  sodium chloride 0.9 % bolus 1,000 mL (0 mLs Intravenous Stopped 06/22/16 1947)     Initial Impression / Assessment and Plan / ED Course  I have reviewed the triage vital signs and the nursing notes.  Pertinent labs & imaging results that were available during my care of the patient were reviewed by me and considered in my medical decision making (see chart for details).    Patient presents with Syncopal episode. Soft blood pressure in route. She has received a liter prior to arrival and blood pressure stabilized. Still on the lower side of blood pressure with tachycardia on arrival.  Exam is reassuring, she is well-appearing,  afebrile nontoxic with a normal neuro exam. Walking in the room without difficulty.  Will obtain labs, EKG, give her IV fluids and CT head and reassess. Negative orthostatic vital sign Patient appears a little bit dehydrated and has signs of UTI on UA. EKG without significant changes from prior tracing, negative delta troponin.  Patient was well-appearing and stable while in ED, asymptomatic.  She doesn't have a history of CAD  ECHO on May 06, 2016: Study Conclusions  - Left ventricle: The cavity size was normal. Systolic function was   normal. The estimated ejection fraction was in the range of 60%   to 65%. Wall motion was normal; there were no regional wall   motion abnormalities. Features are consistent with a pseudonormal   left ventricular filling pattern, with concomitant abnormal  relaxation and increased filling pressure (grade 2 diastolic   dysfunction). Doppler parameters are consistent with high   ventricular filling pressure. - Aortic valve: Trileaflet; normal thickness, mildly calcified   leaflets. - Mitral valve: There was trivial regurgitation.  Heart score : 3  Plan to discharge home with treatment for UTI instructions to stay well-hydrated and very close follow-up with PCP and cardiology for possible Holter monitor. Patient was discussed with Dr. Rubin PayorPickering who agrees with assessment and plan.  Transferred patient care at end of shift to Earley FavorGail Schulz FNP, pending CT results with plan to discharge home   CT resulted negative and I went in the room to give patients results and plan for discharge. She requested that I spoke to her sister and called her over the phone. Her sister was threatening to call the health department and press charges for unsafe discharge. I attempted to explain that we had done a thorough workup and evaluated for life threatening causes of her symptoms, provided rehydration and follow up with cardiology with treatment of infection and that her  exam was very reassuring. Sister stated that there was no one to watch her and we needed to keep her here.  Will place consult to case management and have Manus RuddSchulz follow up with the plan for patient. Dr. Rubin PayorPickering aware.  Final Clinical Impressions(s) / ED Diagnoses   Final diagnoses:  Syncope, unspecified syncope type    New Prescriptions New Prescriptions   CEPHALEXIN (KEFLEX) 500 MG CAPSULE    Take 1 capsule (500 mg total) by mouth 2 (two) times daily.     Gregary CromerMitchell, [place] Sallade B, PA-C 06/22/16 2143    Benjiman CorePickering, Nathan, MD 06/22/16 [phone]
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
## 2016-06-22 NOTE — ED Notes (Signed)
Patient transported to CT 

## 2016-06-22 NOTE — ED Notes (Signed)
Denyse Amassorey, RN went in to room to attempt d/c on patient.  This RN was told patient had concerns for being discharged.  This RN went in to check on patient who was on speaker phone with a family member who asked if I was the Consulting civil engineerCharge RN.  I stated no, and that I was checking on the patient.  Family member on phone asked to speak to Charge RN and provider in charge of her care.  Dondra SpryGail, NP already aware of situation and speaking to social work.  Patient reassured and Consulting civil engineerCharge RN en route.

## 2016-06-22 NOTE — Discharge Instructions (Signed)
As discussed, follow up with cardiology for possible holter monitor. Stay well-hydrated and keep your urine clear. Take your entire course of antibiotics even if you feel better. Follow up with your Primary care provider.  Return to the emergency department if symptoms worsen, you experience confusion, increased dizziness, visual changes, nausea, vomiting, chest pain, shortness of breath or any other new concerning symptoms in the meantime.

## 2016-06-22 NOTE — ED Notes (Signed)
Patient able to ambulate independently  

## 2016-06-22 NOTE — ED Notes (Signed)
Patient ambulated independently to restroom, gait steady and even, able to toilet independently

## 2016-06-22 NOTE — ED Notes (Signed)
Adjusted BP cuff for patient comfort

## 2016-06-22 NOTE — ED Notes (Signed)
PA at bedside.

## 2016-06-24 ENCOUNTER — Telehealth: Payer: Self-pay | Admitting: Surgery

## 2016-06-24 NOTE — Telephone Encounter (Signed)
ED CM attempted to follow up with patient post ED visit, no answer unable to LVM. CM will make second attempt tomorrow.

## 2016-06-30 ENCOUNTER — Telehealth: Payer: Self-pay

## 2016-06-30 NOTE — Telephone Encounter (Signed)
This patient needs to be worked in with Occupational psychologistextender    Trai  ----- Message -----  From: Aaron EdelmanPelkey, Katlynne A, RN  Sent: 06/22/2016 11:37 PM  To: Quintella Reichertraci R Turner, MD    Message sent to scheduling to arrange NP OV.

## 2016-11-04 ENCOUNTER — Other Ambulatory Visit: Payer: Self-pay | Admitting: Family Medicine

## 2016-11-04 DIAGNOSIS — Z1231 Encounter for screening mammogram for malignant neoplasm of breast: Secondary | ICD-10-CM

## 2016-11-26 ENCOUNTER — Ambulatory Visit: Payer: Medicare Other

## 2016-12-07 ENCOUNTER — Ambulatory Visit
Admission: RE | Admit: 2016-12-07 | Discharge: 2016-12-07 | Disposition: A | Discharge status: Home / Self Care | Payer: Medicare Other | Source: Ambulatory Visit | Attending: Family Medicine | Admitting: Family Medicine

## 2016-12-07 DIAGNOSIS — Z1231 Encounter for screening mammogram for malignant neoplasm of breast: Secondary | ICD-10-CM

## 2016-12-09 ENCOUNTER — Other Ambulatory Visit: Payer: Self-pay | Admitting: Family Medicine

## 2016-12-09 ENCOUNTER — Other Ambulatory Visit: Payer: Self-pay | Admitting: Obstetrics & Gynecology

## 2016-12-09 DIAGNOSIS — R928 Other abnormal and inconclusive findings on diagnostic imaging of breast: Secondary | ICD-10-CM

## 2016-12-16 ENCOUNTER — Other Ambulatory Visit: Payer: Medicare Other

## 2016-12-22 ENCOUNTER — Ambulatory Visit
Admission: RE | Admit: 2016-12-22 | Discharge: 2016-12-22 | Disposition: A | Discharge status: Home / Self Care | Payer: Medicare Other | Source: Ambulatory Visit | Attending: Family Medicine | Admitting: Family Medicine

## 2016-12-22 ENCOUNTER — Ambulatory Visit: Payer: Medicare Other

## 2016-12-22 DIAGNOSIS — R928 Other abnormal and inconclusive findings on diagnostic imaging of breast: Secondary | ICD-10-CM

## 2019-08-31 ENCOUNTER — Other Ambulatory Visit: Payer: Self-pay | Admitting: Nephrology

## 2019-08-31 DIAGNOSIS — N1831 Chronic kidney disease, stage 3a: Secondary | ICD-10-CM

## 2019-09-14 ENCOUNTER — Ambulatory Visit
Admission: RE | Admit: 2019-09-14 | Discharge: 2019-09-14 | Disposition: A | Discharge status: Home / Self Care | Payer: Medicare HMO | Source: Ambulatory Visit | Attending: Nephrology | Admitting: Nephrology

## 2019-09-14 DIAGNOSIS — N1831 Chronic kidney disease, stage 3a: Secondary | ICD-10-CM

## 2019-10-21 ENCOUNTER — Other Ambulatory Visit: Payer: Self-pay

## 2019-10-21 ENCOUNTER — Encounter (HOSPITAL_COMMUNITY): Payer: Self-pay

## 2019-10-21 ENCOUNTER — Emergency Department (HOSPITAL_COMMUNITY)
Admission: EM | Admit: 2019-10-21 | Discharge: 2019-10-22 | Disposition: A | Discharge status: Home / Self Care | Payer: Medicare HMO | Attending: Emergency Medicine | Admitting: Emergency Medicine

## 2019-10-21 ENCOUNTER — Emergency Department (HOSPITAL_COMMUNITY): Payer: Medicare HMO

## 2019-10-21 DIAGNOSIS — R739 Hyperglycemia, unspecified: Secondary | ICD-10-CM

## 2019-10-21 DIAGNOSIS — E1165 Type 2 diabetes mellitus with hyperglycemia: Secondary | ICD-10-CM | POA: Diagnosis not present

## 2019-10-21 DIAGNOSIS — R112 Nausea with vomiting, unspecified: Secondary | ICD-10-CM | POA: Diagnosis not present

## 2019-10-21 DIAGNOSIS — I10 Essential (primary) hypertension: Secondary | ICD-10-CM | POA: Insufficient documentation

## 2019-10-21 DIAGNOSIS — Z7984 Long term (current) use of oral hypoglycemic drugs: Secondary | ICD-10-CM | POA: Diagnosis not present

## 2019-10-21 DIAGNOSIS — F17210 Nicotine dependence, cigarettes, uncomplicated: Secondary | ICD-10-CM | POA: Insufficient documentation

## 2019-10-21 DIAGNOSIS — R55 Syncope and collapse: Secondary | ICD-10-CM

## 2019-10-21 DIAGNOSIS — Z79899 Other long term (current) drug therapy: Secondary | ICD-10-CM | POA: Diagnosis not present

## 2019-10-21 DIAGNOSIS — Z20822 Contact with and (suspected) exposure to covid-19: Secondary | ICD-10-CM | POA: Diagnosis not present

## 2019-10-21 LAB — COMPREHENSIVE METABOLIC PANEL
ALT: 20 U/L (ref 0–44)
AST: 20 U/L (ref 15–41)
Albumin: 2.7 g/dL — ABNORMAL LOW (ref 3.5–5.0)
Alkaline Phosphatase: 76 U/L (ref 38–126)
Anion gap: 11 (ref 5–15)
BUN: 25 mg/dL — ABNORMAL HIGH (ref 8–23)
CO2: 24 mmol/L (ref 22–32)
Calcium: 8.1 mg/dL — ABNORMAL LOW (ref 8.9–10.3)
Chloride: 99 mmol/L (ref 98–111)
Creatinine, Ser: 1.82 mg/dL — ABNORMAL HIGH (ref 0.44–1.00)
GFR calc Af Amer: 32 mL/min — ABNORMAL LOW (ref >60)
GFR calc non Af Amer: 28 mL/min — ABNORMAL LOW (ref >60)
Glucose, Bld: 581 mg/dL (ref 70–99)
Potassium: 4.1 mmol/L (ref 3.5–5.1)
Sodium: 134 mmol/L — ABNORMAL LOW (ref 135–145)
Total Bilirubin: 0.5 mg/dL (ref 0.3–1.2)
Total Protein: 5.5 g/dL — ABNORMAL LOW (ref 6.5–8.1)

## 2019-10-21 LAB — CBC
HCT: 31.7 % — ABNORMAL LOW (ref 36.0–46.0)
Hemoglobin: 10 g/dL — ABNORMAL LOW (ref 12.0–15.0)
MCH: 26.2 pg (ref 26.0–34.0)
MCHC: 31.5 g/dL (ref 30.0–36.0)
MCV: 83 fL (ref 80.0–100.0)
Platelets: 177 10*3/uL (ref 150–400)
RBC: 3.82 MIL/uL — ABNORMAL LOW (ref 3.87–5.11)
RDW: 14.3 % (ref 11.5–15.5)
WBC: 6.2 10*3/uL (ref 4.0–10.5)
nRBC: 0 % (ref 0.0–0.2)

## 2019-10-21 LAB — URINALYSIS, ROUTINE W REFLEX MICROSCOPIC
Bacteria, UA: NONE SEEN
Bilirubin Urine: NEGATIVE
Glucose, UA: >=500 mg/dL — AB
Ketones, ur: NEGATIVE mg/dL
Nitrite: NEGATIVE
Protein, ur: 100 mg/dL — AB
Specific Gravity, Urine: 1.012 (ref 1.005–1.030)
pH: 6 (ref 5.0–8.0)

## 2019-10-21 LAB — HEMOGLOBIN A1C
Hgb A1c MFr Bld: 15 % — ABNORMAL HIGH (ref 4.8–5.6)
Mean Plasma Glucose: 383.8 mg/dL

## 2019-10-21 LAB — TROPONIN I (HIGH SENSITIVITY)
Troponin I (High Sensitivity): 23 ng/L — ABNORMAL HIGH (ref <18)
Troponin I (High Sensitivity): 29 ng/L — ABNORMAL HIGH (ref <18)

## 2019-10-21 LAB — BETA-HYDROXYBUTYRIC ACID: Beta-Hydroxybutyric Acid: 0.17 mmol/L (ref 0.05–0.27)

## 2019-10-21 LAB — RESPIRATORY PANEL BY RT PCR (FLU A&B, COVID)
Influenza A by PCR: NEGATIVE
Influenza B by PCR: NEGATIVE
SARS Coronavirus 2 by RT PCR: NEGATIVE

## 2019-10-21 LAB — CBG MONITORING, ED
Glucose-Capillary: 559 mg/dL (ref 70–99)
Glucose-Capillary: 459 mg/dL — ABNORMAL HIGH (ref 70–99)
Glucose-Capillary: 206 mg/dL — ABNORMAL HIGH (ref 70–99)

## 2019-10-21 MED ORDER — SODIUM CHLORIDE 0.9 % IV BOLUS
1500.0000 mL | Freq: Once | INTRAVENOUS | Status: AC
  Administered 2019-10-21: 1500 mL via INTRAVENOUS

## 2019-10-21 MED ORDER — INSULIN ASPART 100 UNIT/ML ~~LOC~~ SOLN
14.0000 [IU] | Freq: Once | SUBCUTANEOUS | Status: AC
  Administered 2019-10-21: 14 [IU] via INTRAVENOUS

## 2019-10-21 MED ORDER — SODIUM CHLORIDE 0.9 % IV BOLUS
1000.0000 mL | Freq: Once | INTRAVENOUS | Status: AC
  Administered 2019-10-21: 1000 mL via INTRAVENOUS

## 2019-10-21 NOTE — ED Triage Notes (Addendum)
Patient arrived by [facility] from store after having syncopal episode there. Patient reports has been having similar events at home. Out of both diabetic and HTN meds for a few weeks. EMS reports patient became unresponsive in truck and than projectile vomited, state BP 90s systolic while enroute. On arrival alert and orientd, denies pain. Code STEMI canceled by Dr. Excell Seltzer.

## 2019-10-21 NOTE — ED Provider Notes (Addendum)
  Physical Exam  BP (!) 153/88   Pulse 77   Temp (!) 96.9 F (36.1 C) (Oral)   Resp 13   Ht 5\' 5"  (1.651 m)   Wt 49 kg   SpO2 100%   BMI 17.97 kg/m   Physical Exam  ED Course/Procedures     .Critical Care Performed by: , MD Authorized by: Derwood Kaplan, MD   Critical care provider statement:    Critical care time (minutes):  32   Critical care was necessary to treat or prevent imminent or life-threatening deterioration of the following conditions:  Metabolic crisis and endocrine crisis   Critical care was time spent personally by me on the following activities:  Discussions with consultants, evaluation of patient's response to treatment, examination of patient, ordering and performing treatments and interventions, ordering and review of laboratory studies, ordering and review of radiographic studies, pulse oximetry, re-evaluation of patient's condition, obtaining history from patient or surrogate and review of old charts    MDM   Patient comes into the ER with chief complaint of syncope. Patient has had multiple syncopal episodes over the last several years.  She reports that she has been having syncope every month.  She has seen cardiology couple of years back and was cleared.  At the moment she was undergoing neurology evaluation and that has also turned up reassuring.  She comes to the ER after she had a syncopal episode at a store.  Patient had emesis thereafter.  She denies any prodrome.  Most of the time she does not get a prodrome with her syncope, sometimes she will get dizzy prior to it.  Her blood sugar was high.  She admits to not taking her insulin the last 2 or 3 days.  No DKA.  She was given fluids in the ER along with 14 units of IV NovoLog and her sugars have improved.  Labs are reassuring besides slight elevated creatinine and mildly elevated at bedtime TN.  We do not think patient is having ACS.  The suspicion for cardiac etiology is lower,  especially given that she does not have any known history of CAD.  That being said, with recurrent episodes it might be worthwhile for her to see cardiology service.  Her EKG today is having some nonspecific changes compared to EKG 2 years ago.  Most likely these are not acutely new changes.  Repeat EKG is unchanged.  Patient denies chest pain, shortness of breath, palpitations anytime today or prior to syncope.  Cardiac monitoring in the ER was reassuring.  Stable for discharge with strict ER return precautions.    EKG Interpretation  Date/Time:  Saturday October 21 2019 17:16:09 EDT Ventricular Rate:  77 PR Interval:    QRS Duration: 75 QT Interval:  456 QTC Calculation: 517 R Axis:   74 Text Interpretation: Sinus rhythm Biatrial enlargement Left ventricular hypertrophy Prolonged QT interval NO NEW CHANGES Confirmed by [id number]) on 10/21/2019 6:02:45 PM         12/21/2019, MD 10/21/19 12/21/19    Aretha Parrot, MD 10/21/19 12/21/19

## 2019-10-21 NOTE — ED Provider Notes (Signed)
[facility] EMERGENCY DEPARTMENT Provider Note   CSN: [id number] Arrival date & time: 10/21/19  1359     History Chief Complaint  Patient presents with  . Loss of Consciousness    Tina Davis is a 69 y.o. female.  Patient arrives via EMS after syncopal event. Patient was at a store, felt faint, had syncopal event. EMS was called. They indicate in sitting up on stretcher and taking to unit, patient became unresponsive, and after coming to had episode nausea/vomiting. Emesis was not bloody, not bilious. No abd pain or diarrhea. Pt notes hx prior episodes of syncope - states occurs at rest, sometime preceded by an episode of nausea/vomiting, and at other times just becomes lightheaded/faint. Denies any associated chest pain or discomfort. No sob. No palpitations. Pt with hx IDDM, but states has taken insulin in past few days. +polyuria. Denies any recent blood loss, rectal bleeding, or melena. No new meds. No fever or chills. Denies any current or recent chest pain or discomfort. No leg pain or swelling.  The history is provided by the patient and the EMS personnel.  Loss of Consciousness Associated symptoms: nausea and vomiting   Associated symptoms: no chest pain, no confusion, no fever, no headaches, no palpitations, no seizures and no shortness of breath        Past Medical History:  Diagnosis Date  . Diabetes mellitus without complication (HCC)   . Hypertension     Patient Active Problem List   Diagnosis Date Noted  . Essential hypertension 05/05/2016  . Insulin-requiring or dependent type II diabetes mellitus (HCC) 05/05/2016  . Syncope 05/05/2016  . Type 2 diabetes mellitus without complication (HCC)     History reviewed. No pertinent surgical history.   OB History   No obstetric history on file.     Family History  Problem Relation Age of Onset  . Hypertension Mother   . Diabetes Father   . Diabetes Sister   . Breast cancer Neg Hx      Social History   Tobacco Use  . Smoking status: Current Every Day Smoker    Types: Cigarettes  . Smokeless tobacco: Never Used  Substance Use Topics  . Alcohol use: No  . Drug use: No    Home Medications Prior to Admission medications   Medication Sig Start Date End Date Taking? Authorizing Provider  ESTROGENS CONJ SYNTHETIC B PO Take 1 tablet by mouth as needed (for flashes and sleep). Take the pm of this medication    [provider]  glipiZIDE (GLUCOTROL XL) 5 MG 24 hr tablet Take 5 mg by mouth daily with breakfast.    [provider]  ibuprofen (ADVIL,MOTRIN) 600 MG tablet Take 1 tablet (600 mg total) by mouth every 6 (six) hours as needed for moderate pain. Patient not taking: Reported on 06/22/2016 05/06/16   Joseph Art, DO  loratadine (CLARITIN) 10 MG tablet Take 10 mg by mouth daily as needed for allergies.    [provider]  losartan-hydrochlorothiazide (HYZAAR) 100-12.5 MG tablet Take 1 tablet by mouth daily.    [provider]  metFORMIN (GLUCOPHAGE) 500 MG tablet Take 1,000 mg by mouth 2 (two) times daily with a meal.    [provider]  Naproxen Sod-Diphenhydramine (ALEVE PM PO) Take 1 tablet by mouth at bedtime as needed (sleep).    [provider]  vitamin B-12 (CYANOCOBALAMIN) 100 MCG tablet Take 1 tablet by mouth daily.    [provider]  Allergies    Patient has no known allergies.  Review of Systems   Review of Systems  Constitutional: Negative for chills and fever.  HENT: Negative for sore throat.   Eyes: Negative for visual disturbance.  Respiratory: Negative for cough and shortness of breath.   Cardiovascular: Positive for syncope. Negative for chest pain, palpitations and leg swelling.  Gastrointestinal: Positive for nausea and vomiting. Negative for abdominal pain and blood in stool.  Endocrine: Positive for polyuria.  Genitourinary: Negative for dysuria and flank pain.   Musculoskeletal: Negative for back pain and neck pain.  Skin: Negative for rash.  Neurological: Positive for syncope and light-headedness. Negative for seizures, speech difficulty, numbness and headaches.  Hematological: Does not bruise/bleed easily.  Psychiatric/Behavioral: Negative for confusion.    Physical Exam Updated Vital Signs BP 133/77   Pulse 72   Temp (!) 96.9 F (36.1 C) (Oral)   Resp 18   Ht 1.651 m (5\' 5" )   Wt 49 kg   SpO2 100%   BMI 17.97 kg/m   Physical Exam Vitals and nursing note reviewed.  Constitutional:      Appearance: Normal appearance. She is well-developed.  HENT:     Head: Atraumatic.     Nose: Nose normal.     Mouth/Throat:     Mouth: Mucous membranes are moist.  Eyes:     General: No scleral icterus.    Conjunctiva/sclera: Conjunctivae normal.     Pupils: Pupils are equal, round, and reactive to light.  Neck:     Vascular: No carotid bruit.     Trachea: No tracheal deviation.  Cardiovascular:     Rate and Rhythm: Normal rate and regular rhythm.     Pulses: Normal pulses.     Heart sounds: Normal heart sounds. No murmur heard.  No friction rub. No gallop.   Pulmonary:     Effort: Pulmonary effort is normal. No respiratory distress.     Breath sounds: Normal breath sounds.  Chest:     Chest wall: No tenderness.  Abdominal:     General: Bowel sounds are normal. There is no distension.     Palpations: Abdomen is soft.     Tenderness: There is no abdominal tenderness. There is no guarding.  Genitourinary:    Comments: No cva tenderness.  Musculoskeletal:        General: No swelling or tenderness.     Cervical back: Normal range of motion and neck supple. No rigidity. No muscular tenderness.     Right lower leg: No edema.     Left lower leg: No edema.     Comments: CTLS spine, non tender, aligned, no step off.   Skin:    General: Skin is warm and dry.     Findings: No rash.  Neurological:     Mental Status: She is alert.      Comments: Alert, speech normal. GCS 15. Motor intact bil, stre 5/5. Sens grossly intact bil.   Psychiatric:        Mood and Affect: Mood normal.     ED Results / Procedures / Treatments   Labs (all labs ordered are listed, but only abnormal results are displayed) Results for orders placed or performed during the hospital encounter of 10/21/19  CBG monitoring, ED  Result Value Ref Range   Glucose-Capillary 559 (HH) 70 - 99 mg/dL   DG Chest Port 1 View  Result Date: 10/21/2019 CLINICAL DATA:  Syncopal episode today. EXAM: PORTABLE CHEST 1 VIEW COMPARISON:  PA and lateral chest 05/05/2016. FINDINGS: Lungs clear. Heart size normal. No pneumothorax or pleural fluid. No acute bony abnormality. Remote, healed left clavicle fracture noted. IMPRESSION: No acute disease. Electronically Signed   By: Drusilla Kanner M.D.   On: 10/21/2019 15:00    EKG EKG Interpretation  Date/Time:  Saturday October 21 2019 14:02:11 EDT Ventricular Rate:  75 PR Interval:    QRS Duration: 98 QT Interval:  426 QTC Calculation: 476 R Axis:   68 Text Interpretation: Sinus rhythm Left ventricular hypertrophy Non-specific ST-t changes Confirmed by Cathren Laine ([id number]) on 10/21/2019 2:21:11 PM   Radiology DG Chest Port 1 View  Result Date: 10/21/2019 CLINICAL DATA:  Syncopal episode today. EXAM: PORTABLE CHEST 1 VIEW COMPARISON:  PA and lateral chest 05/05/2016. FINDINGS: Lungs clear. Heart size normal. No pneumothorax or pleural fluid. No acute bony abnormality. Remote, healed left clavicle fracture noted. IMPRESSION: No acute disease. Electronically Signed   By: Drusilla Kanner M.D.   On: 10/21/2019 15:00    Procedures Procedures (including critical care time)  Medications Ordered in ED Medications - No data to display  ED Course  I have reviewed the triage vital signs and the nursing notes.  Pertinent labs & imaging results that were available during my care of the patient were reviewed by me and  considered in my medical decision making (see chart for details).    MDM Rules/Calculators/A&P                          Iv ns. Continuous pulse ox and cardiac monitoring - sinus rhythm. Stat labs.   Patient arrived via EMS as code stemi activated by EMS - reviewed ecg, and discussed with Dr Excell Seltzer - code stemi cancelled. Pt denies any chest pain or discomfort.   Reviewed nursing notes and prior charts for additional history.   EMS had indicated glucose read > 600. Iv ns bolus. Await ED labs.   Labs reviewed/interpreted by me - cbg high. Chem pending.   CXR reviewed/interpretd by me - no pna.   SIgned out to Dr Rhunette Croft to check labs when back, start insulin therapy depending on chem/labs, and given recurrent syncope and uncontrolled hyperglycemia, facilitate admission.     Final Clinical Impression(s) / ED Diagnoses Final diagnoses:  None    Rx / DC Orders ED Discharge Orders    None       Cathren Laine, MD 10/21/19 1515

## 2019-10-22 DIAGNOSIS — R55 Syncope and collapse: Secondary | ICD-10-CM | POA: Diagnosis not present

## 2019-10-22 LAB — CBG MONITORING, ED: Glucose-Capillary: 234 mg/dL — ABNORMAL HIGH (ref 70–99)

## 2019-10-22 NOTE — Discharge Instructions (Signed)
Please follow-up with cardiology for further evaluation of your recurrent fainting spells. You can follow-up with our cardiologist at [facility] by calling the number provided or calling your primary care doctor for referral.  Return to the ER if your symptoms return.  [place] law prevents people with seizures or fainting from driving or operating dangerous machinery until they are free of seizures or fainting for 6 months.  If you are in need of a shelter please call [facility] ([facility]) at [phone] between the hours of 9am-5pm Mon-Fri.  [facility] will contact all the local shelters to find openings.  Right now they are requiring people to quarantine at a hotel before they can go to an open bed but [facility] may be able to set that up as well.  They are not open on weekends.  On Monday-Friday morning at 8 am until 1 pm, you can also go to the [employer] (see below) to seek shelter in the [facility] prior to entering a shelter. You can also call the number provided (see the above paragraph) to seek placement into the program by calling [facility].   Interactive resource center [facility]) [address] [place], [place] [address] Phone: [phone] Fax: [phone]  For Free Breakfasts and Lunches 7 days a week you can go to: [facility] [address] [place], [place], [place] [address] Hours: Open today  8AM-5PM Phone: [phone] Breakfast: 6:30-7:30 am Lunch served: 10:40 am - 12:40pm         DAY [facility]) M-F 8am-3pm   [address] [place], [place] [address]   [phone] Services include: laundry, barbering, support groups, case management, phone  & computer access, showers, AA/NA mtgs, mental health/substance abuse nurse, job skills class, disability information, VA assistance, spiritual classes, etc.   [facility] [address] [place], [place]    [phone] Provides breakfast each weekday morning except Wednesdays, and an evening community meal every Friday. Access to showers is available during breakfast hours and telephones for seeking work are also provided. Also offers job referral and counseling for the homeless and unemployed.  HOMELESS SHELTERS [facility]   [employer] [phone]     [facility] [address] [place], [place] [address]     [address], [place]  [phone]      [phone]  [facility]   [facility] [place] [address] [place]    [address] [place] [address]     [place], [place] [address] [phone]       [phone]  [facility] (women only) [address] [place], [place] [address] [phone]  [facility]: Marcy [place] (overflow shelter: [address] [place], [place] check in at 6pm for placement in local shelter).  [address] [place], [place], [place] [address] Phone: [phone]  Crisis Services [facility]     [facility]   Crisis Services      [facility] [phone]. [address]     [address] [place], [place] [address]     [place], [place] [address]    [facility] [phone]   For assistance for assisted living please contact [facility] DSS at [phone] for their adult placement unit.

## 2019-10-22 NOTE — TOC Initial Note (Signed)
Transition of Care [facility]) - Initial/Assessment Note    Patient Details  Name: Tina Davis MRN: [id number] Date of Birth: 1950-07-19  [facility] CM/SW Contact:    Lockie Pares, RN Phone Number: 10/22/2019, 10:43 AM  Clinical Narrative:                 Consult received, spoke to patient about shelter, medications verfied she does have insurance. Patient states she has been staying with her mother, she is on hospice, she cannot go back there. She wants information on shelters. When asked her if she could pay for her medications she said "yes"  I asked if there is any friends or family she could stay with, she stated "only in [place]" She states she has money to stay in a hotel.tonight, and will go to [facility] tomorrow. Giving her Information on shelters in the area, as well as information via patient instructions to start search for independent /assisted living. She states she will pick up medications that are prescribed.     Barriers to Discharge: Barriers Resolved   Patient Goals and CMS Choice        Expected Discharge Plan and Services                                                Prior Living Arrangements/Services                       Activities of Daily Living      Permission Sought/Granted                  Emotional Assessment              Admission diagnosis:  stemi Patient Active Problem List   Diagnosis Date Noted  . Essential hypertension 05/05/2016  . Insulin-requiring or dependent type II diabetes mellitus (HCC) 05/05/2016  . Syncope 05/05/2016  . Type 2 diabetes mellitus without complication (HCC)    PCP:  Macy Mis, MD Pharmacy:   [facility] [address], [place] - [address] [place] [address] Phone: [phone] Fax: [phone]     Social Determinants of Health (SDOH) Interventions    Readmission Risk Interventions No flowsheet data found.

## 2019-10-22 NOTE — ED Notes (Signed)
Water given to pt 

## 2019-10-24 ENCOUNTER — Emergency Department (HOSPITAL_COMMUNITY)
Admission: EM | Admit: 2019-10-24 | Discharge: 2019-10-24 | Disposition: A | Discharge status: Home / Self Care | Payer: Medicare HMO | Attending: Emergency Medicine | Admitting: Emergency Medicine

## 2019-10-24 ENCOUNTER — Other Ambulatory Visit: Payer: Self-pay

## 2019-10-24 ENCOUNTER — Emergency Department (HOSPITAL_COMMUNITY): Payer: Medicare HMO

## 2019-10-24 DIAGNOSIS — E1165 Type 2 diabetes mellitus with hyperglycemia: Secondary | ICD-10-CM | POA: Insufficient documentation

## 2019-10-24 DIAGNOSIS — R739 Hyperglycemia, unspecified: Secondary | ICD-10-CM | POA: Diagnosis present

## 2019-10-24 DIAGNOSIS — D649 Anemia, unspecified: Secondary | ICD-10-CM

## 2019-10-24 DIAGNOSIS — Z7984 Long term (current) use of oral hypoglycemic drugs: Secondary | ICD-10-CM | POA: Diagnosis not present

## 2019-10-24 DIAGNOSIS — I10 Essential (primary) hypertension: Secondary | ICD-10-CM

## 2019-10-24 DIAGNOSIS — F17210 Nicotine dependence, cigarettes, uncomplicated: Secondary | ICD-10-CM | POA: Diagnosis not present

## 2019-10-24 DIAGNOSIS — N390 Urinary tract infection, site not specified: Secondary | ICD-10-CM | POA: Diagnosis not present

## 2019-10-24 DIAGNOSIS — R410 Disorientation, unspecified: Secondary | ICD-10-CM | POA: Insufficient documentation

## 2019-10-24 DIAGNOSIS — N289 Disorder of kidney and ureter, unspecified: Secondary | ICD-10-CM

## 2019-10-24 LAB — URINALYSIS, ROUTINE W REFLEX MICROSCOPIC
Bilirubin Urine: NEGATIVE
Glucose, UA: >=500 mg/dL — AB
Ketones, ur: NEGATIVE mg/dL
Nitrite: NEGATIVE
Protein, ur: 100 mg/dL — AB
Specific Gravity, Urine: 1.012 (ref 1.005–1.030)
pH: 5 (ref 5.0–8.0)

## 2019-10-24 LAB — CBC
HCT: 31.5 % — ABNORMAL LOW (ref 36.0–46.0)
Hemoglobin: 10.1 g/dL — ABNORMAL LOW (ref 12.0–15.0)
MCH: 26.2 pg (ref 26.0–34.0)
MCHC: 32.1 g/dL (ref 30.0–36.0)
MCV: 81.6 fL (ref 80.0–100.0)
Platelets: 191 10*3/uL (ref 150–400)
RBC: 3.86 MIL/uL — ABNORMAL LOW (ref 3.87–5.11)
RDW: 14.4 % (ref 11.5–15.5)
WBC: 7 10*3/uL (ref 4.0–10.5)
nRBC: 0 % (ref 0.0–0.2)

## 2019-10-24 LAB — CBG MONITORING, ED
Glucose-Capillary: 369 mg/dL — ABNORMAL HIGH (ref 70–99)
Glucose-Capillary: 320 mg/dL — ABNORMAL HIGH (ref 70–99)
Glucose-Capillary: 337 mg/dL — ABNORMAL HIGH (ref 70–99)

## 2019-10-24 LAB — BASIC METABOLIC PANEL
Anion gap: 11 (ref 5–15)
BUN: 21 mg/dL (ref 8–23)
CO2: 23 mmol/L (ref 22–32)
Calcium: 8.5 mg/dL — ABNORMAL LOW (ref 8.9–10.3)
Chloride: 99 mmol/L (ref 98–111)
Creatinine, Ser: 1.81 mg/dL — ABNORMAL HIGH (ref 0.44–1.00)
GFR calc non Af Amer: 28 mL/min — ABNORMAL LOW (ref >60)
Glucose, Bld: 353 mg/dL — ABNORMAL HIGH (ref 70–99)
Potassium: 3.5 mmol/L (ref 3.5–5.1)
Sodium: 133 mmol/L — ABNORMAL LOW (ref 135–145)

## 2019-10-24 MED ORDER — CEPHALEXIN 250 MG PO CAPS
500.0000 mg | ORAL_CAPSULE | Freq: Once | ORAL | Status: AC
  Administered 2019-10-24: 500 mg via ORAL
  Filled 2019-10-24: qty 2

## 2019-10-24 MED ORDER — CEPHALEXIN 500 MG PO CAPS: 500.0000 mg | ORAL_CAPSULE | Freq: Three times a day (TID) | ORAL | 0 refills | Status: AC

## 2019-10-24 NOTE — ED Notes (Signed)
Pt called for vital check no answer. 

## 2019-10-24 NOTE — Discharge Instructions (Addendum)
Return if symptoms are getting worse.   If you don't feel you have improved with taking the antibiotic, then see the neurologist for further evaluation.

## 2019-10-24 NOTE — ED Triage Notes (Signed)
Patient arrives to ED with complaints of feeling "off." States that she has not been on her insulin in "awhile." Pt states she feels lightheaded and nauseous. Was in ED on 10/2 for same.

## 2019-10-24 NOTE — Progress Notes (Addendum)
Inpatient Diabetes Program Recommendations  AACE/ADA: New Consensus Statement on Inpatient Glycemic Control (2015)  Target Ranges:  Prepandial:   less than 140 mg/dL      Peak postprandial:   less than 180 mg/dL (1-2 hours)      Critically ill patients:  140 - 180 mg/dL   Lab Results  Component Value Date   GLUCAP 369 (H) 10/24/2019   HGBA1C 15.0 (H) 10/21/2019    Review of Glycemic Control Results for Tina Davis, Tina Davis (MRN [id number]) as of 10/24/2019 14:13  Ref. Range 10/24/2019 12:16  Glucose-Capillary Latest Ref Range: 70 - 99 mg/dL 569 (H)   Diabetes history: Type 2 DM Outpatient Diabetes medications: Glipizide 5 mg QAM, Metformin 1000 mg BID Current orders for Inpatient glycemic control: none  Inpatient Diabetes Program Recommendations:    Noted second visit to ED in last week. Per previous case management note, patient able to afford medications. Has not taken insulin "in awhile".  Is followed by outpatient endocrinology with last visit on 08/22/19. At this visit, patient was taking Basaglar 20 units QHS and Actos 30 mg QD.  Unclear as to why patient did not pick up prescriptions. Once admitted, will need to see.   Thanks, Lujean Rave, MSN, RNC-OB Diabetes Coordinator [phone] (8a-5p)

## 2019-10-24 NOTE — ED Provider Notes (Signed)
[facility] EMERGENCY DEPARTMENT Provider Note   CSN: [id number] Arrival date & time: 10/24/19  1141   History Chief Complaint  Patient presents with  . Hyperglycemia    Tina Davis is a 69 y.o. female.  The history is provided by the patient.  Hyperglycemia She has history of hypertension, diabetes and comes in stating that she has been feeling "off" for about the last month. She states that she is sometimes confused, sometimes has trouble remembering things such as names. She feels that it is getting worse. She denies headache. She denies chest pain, dyspnea, nausea, vomiting. She states that she has not taken her insulin for the last couple of days because she ran out. Apparently, medications have been difficult for her since she moved recently. She denies fever or chills and denies dysuria.  Past Medical History:  Diagnosis Date  . Diabetes mellitus without complication (HCC)   . Hypertension     Patient Active Problem List   Diagnosis Date Noted  . Essential hypertension 05/05/2016  . Insulin-requiring or dependent type II diabetes mellitus (HCC) 05/05/2016  . Syncope 05/05/2016  . Type 2 diabetes mellitus without complication (HCC)     No past surgical history on file.   OB History   No obstetric history on file.     Family History  Problem Relation Age of Onset  . Hypertension Mother   . Diabetes Father   . Diabetes Sister   . Breast cancer Neg Hx     Social History   Tobacco Use  . Smoking status: Current Every Day Smoker    Types: Cigarettes  . Smokeless tobacco: Never Used  Substance Use Topics  . Alcohol use: No  . Drug use: No    Home Medications Prior to Admission medications   Medication Sig Start Date End Date Taking? Authorizing Provider  ESTROGENS CONJ SYNTHETIC B PO Take 1 tablet by mouth as needed (for flashes and sleep). Take the pm of this medication    [provider]  glipiZIDE (GLUCOTROL XL) 5 MG 24  hr tablet Take 5 mg by mouth daily with breakfast.    [provider]  ibuprofen (ADVIL,MOTRIN) 600 MG tablet Take 1 tablet (600 mg total) by mouth every 6 (six) hours as needed for moderate pain. Patient not taking: Reported on 06/22/2016 05/06/16   Joseph Art, DO  loratadine (CLARITIN) 10 MG tablet Take 10 mg by mouth daily as needed for allergies.    [provider]  losartan-hydrochlorothiazide (HYZAAR) 100-12.5 MG tablet Take 1 tablet by mouth daily.    [provider]  metFORMIN (GLUCOPHAGE) 500 MG tablet Take 1,000 mg by mouth 2 (two) times daily with a meal.    [provider]  Naproxen Sod-Diphenhydramine (ALEVE PM PO) Take 1 tablet by mouth at bedtime as needed (sleep).    [provider]  vitamin B-12 (CYANOCOBALAMIN) 100 MCG tablet Take 1 tablet by mouth daily.    [provider]    Allergies    Patient has no known allergies.  Review of Systems   Review of Systems  All other systems reviewed and are negative.   Physical Exam Updated Vital Signs BP 140/68 (BP Location: Right Arm)   Pulse (!) 108   Temp 98.5 F (36.9 C) (Oral)   Resp 16   SpO2 100%   Physical Exam Vitals and nursing note reviewed.   69 year old female, resting comfortably and in no acute distress. Vital signs  are significant for mildly elevated heart rate. Oxygen saturation is 100%, which is normal. Head is normocephalic and atraumatic. PERRLA, EOMI. Oropharynx is clear. Neck is nontender and supple without adenopathy or JVD. Back is nontender and there is no CVA tenderness. Lungs are clear without rales, wheezes, or rhonchi. Chest is nontender. Heart has regular rate and rhythm without murmur. Abdomen is soft, flat, nontender without masses or hepatosplenomegaly and peristalsis is normoactive. Extremities have no cyanosis or edema, full range of motion is present. Skin is warm and dry without rash. Neurologic: Mental status is normal, cranial  nerves are intact, there are no motor or sensory deficits.   ED Results / Procedures / Treatments   Labs (all labs ordered are listed, but only abnormal results are displayed) Labs Reviewed  BASIC METABOLIC PANEL - Abnormal; Notable for the following components:      Result Value   Sodium 133 (*)    Glucose, Bld 353 (*)    Creatinine, Ser 1.81 (*)    Calcium 8.5 (*)    GFR calc non Af Amer 28 (*)    All other components within normal limits  CBC - Abnormal; Notable for the following components:   RBC 3.86 (*)    Hemoglobin 10.1 (*)    HCT 31.5 (*)    All other components within normal limits  URINALYSIS, ROUTINE W REFLEX MICROSCOPIC - Abnormal; Notable for the following components:   APPearance HAZY (*)    Glucose, UA >=500 (*)    Hgb urine dipstick SMALL (*)    Protein, ur 100 (*)    Leukocytes,Ua LARGE (*)    Bacteria, UA RARE (*)    All other components within normal limits  CBG MONITORING, ED - Abnormal; Notable for the following components:   Glucose-Capillary 369 (*)    All other components within normal limits  CBG MONITORING, ED - Abnormal; Notable for the following components:   Glucose-Capillary 320 (*)    All other components within normal limits  CBG MONITORING, ED - Abnormal; Notable for the following components:   Glucose-Capillary 337 (*)    All other components within normal limits  URINE CULTURE   Radiology CT Head Wo Contrast  Result Date: 10/24/2019 CLINICAL DATA:  Mental status changes EXAM: CT HEAD WITHOUT CONTRAST TECHNIQUE: Contiguous axial images were obtained from the base of the skull through the vertex without intravenous contrast. COMPARISON:  06/22/2016 FINDINGS: Brain: Mild atrophic changes and chronic white matter ischemic changes are noted. No findings to suggest acute hemorrhage, acute infarction or space-occupying mass lesion are noted. Vascular: No hyperdense vessel or unexpected calcification. Skull: Normal. Negative for fracture or focal  lesion. Sinuses/Orbits: No acute finding. Other: None. IMPRESSION: Chronic atrophic and ischemic changes similar to that seen on the prior exam. No acute abnormality noted. Electronically Signed   By: Alcide Clever M.D.   On: 10/24/2019 16:54    Procedures Procedures   Medications Ordered in ED Medications  cephALEXin (KEFLEX) capsule 500 mg (has no administration in time range)    ED Course  I have reviewed the triage vital signs and the nursing notes.  Pertinent labs & imaging results that were available during my care of the patient were reviewed by me and considered in my medical decision making (see chart for details).  MDM Rules/Calculators/A&P Mild confusion, etiology unclear. Possible early dementia. Need to rule out organic causes. Labs show moderate hyperglycemia, mild to moderate renal insufficiency. Renal insufficiency is unchanged from ED visit 3 days  ago. Mild anemia is present and is unchanged from 3 days ago. Need to check urinalysis to make sure she does not have an occult UTI. Will also check CT of head. Old records are reviewed showing ED visit 3 days ago at which time urinalysis was normal. Prior to that visit, last labs were from 2018 and renal function has deteriorated since then.  CT of head shows no acute process.  Urinalysis is suggestive of UTI, but not diagnostic.  Specimen sent for culture and she is given a prescription for cephalexin.  She is referred back to her PCP for follow-up.  If mental state seems to improve with treatment of UTI, no further work-up necessary.  However, if her mental state does not improve, recommended follow-up with neurology.  Final Clinical Impression(s) / ED Diagnoses Final diagnoses:  Urinary tract infection without hematuria, site unspecified  Renal insufficiency  Normochromic normocytic anemia  Elevated blood pressure reading with diagnosis of hypertension    Rx / DC Orders ED Discharge Orders         Ordered    cephALEXin  (KEFLEX) 500 MG capsule  3 times daily        10/24/19 1915           Dione Booze, MD 10/24/19 [phone]

## 2019-10-25 LAB — URINE CULTURE

## 2020-10-29 ENCOUNTER — Other Ambulatory Visit (HOSPITAL_BASED_OUTPATIENT_CLINIC_OR_DEPARTMENT_OTHER): Payer: Self-pay | Admitting: Internal Medicine

## 2020-10-29 DIAGNOSIS — Z1231 Encounter for screening mammogram for malignant neoplasm of breast: Secondary | ICD-10-CM

## 2020-10-31 ENCOUNTER — Telehealth (HOSPITAL_BASED_OUTPATIENT_CLINIC_OR_DEPARTMENT_OTHER): Payer: Self-pay

## 2020-11-22 ENCOUNTER — Ambulatory Visit: Payer: Medicare HMO | Admitting: Family

## 2021-03-14 IMAGING — CT CT HEAD W/O CM
4 series · 16 of 47 positions shown, 18 images · non-contrast
Comparison: 06/22/2016

CLINICAL DATA: Mental status changes

EXAM:
CT HEAD WITHOUT CONTRAST
TECHNIQUE: Contiguous axial images were obtained from the base of the skull
through the vertex without intravenous contrast.

[Series 3: head without · axial · non-contrast · 0.47mm/px · z∈[-103,+17]mm · 7 of 33 slices shown, 9 images]
[im 5/33  brain]
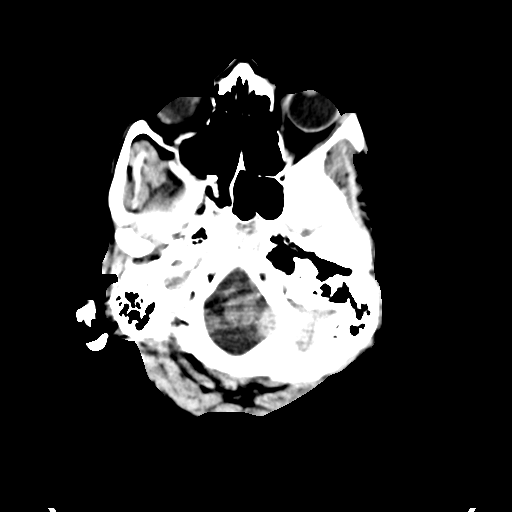
[im 5/33  bone]
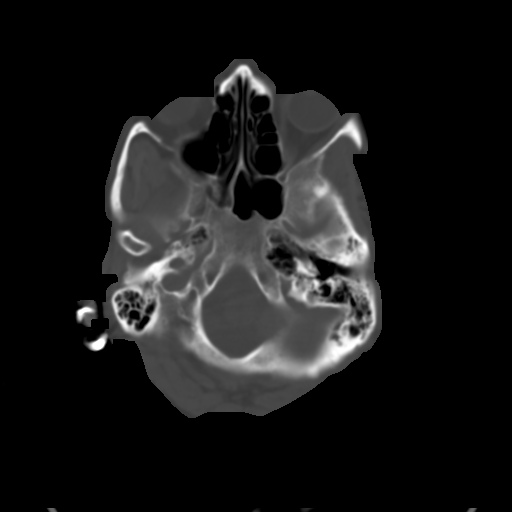
[im 9/33  brain]
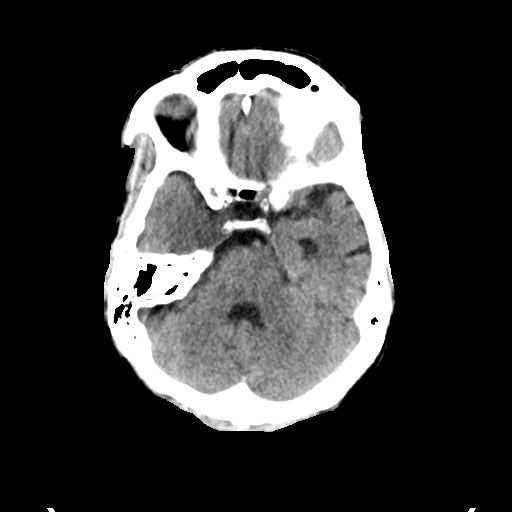
[im 13/33  brain]
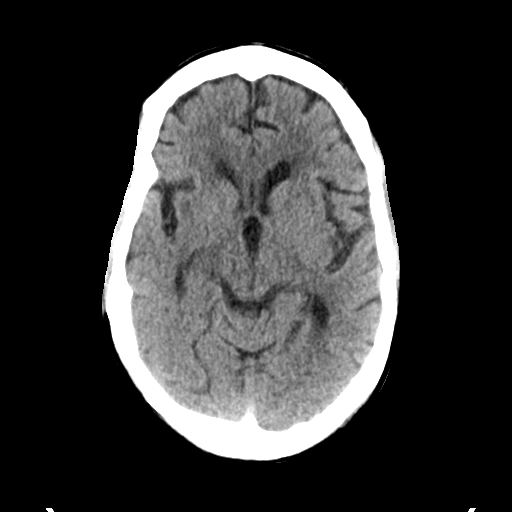
[im 17/33  brain]
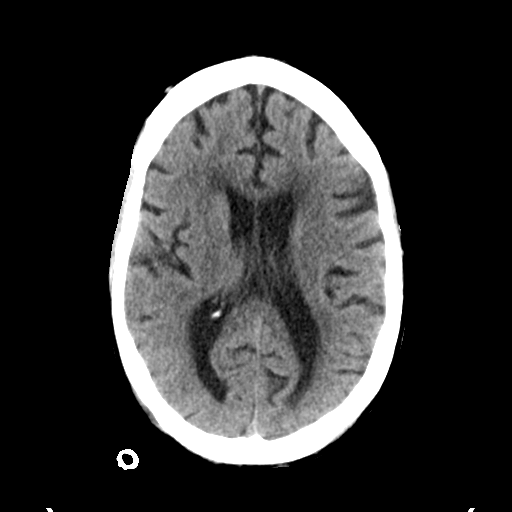
[im 21/33  brain]
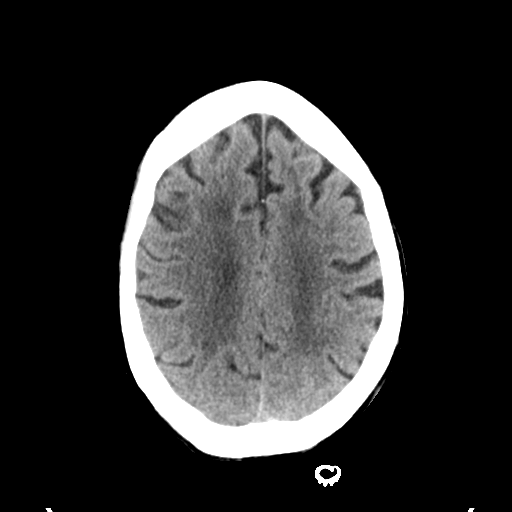
[im 21/33  bone]
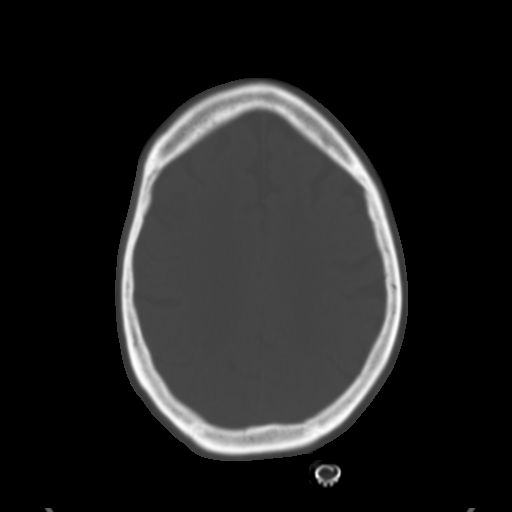
[im 25/33  brain]
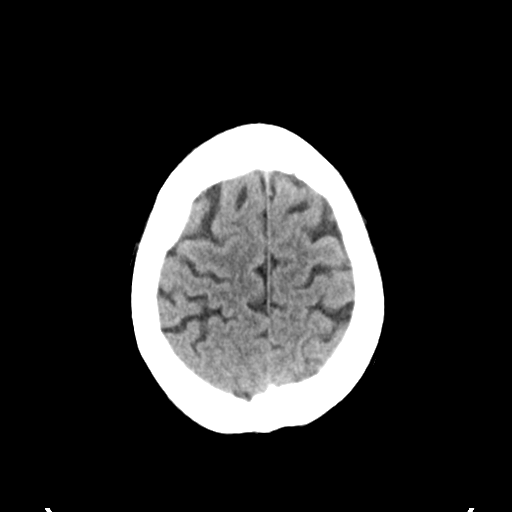
[im 29/33  brain]
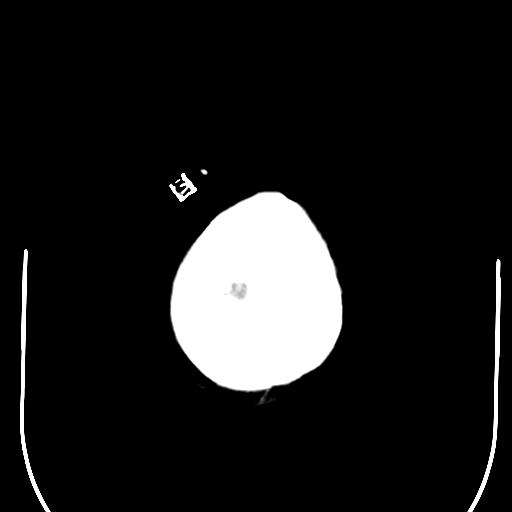

[Series 4: head bone · axial · 0.47mm/px · z∈[-107,-75]mm · 3 of 82 slices shown]
[im 9/82  bone]
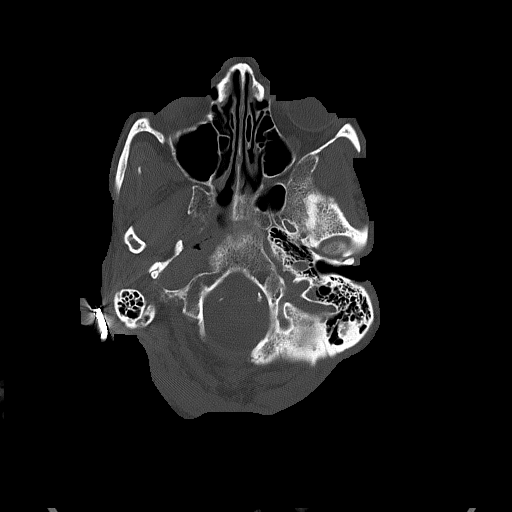
[im 17/82  bone]
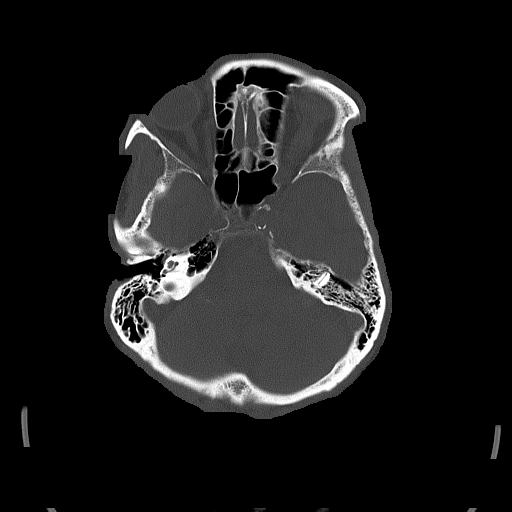
[im 25/82  bone]
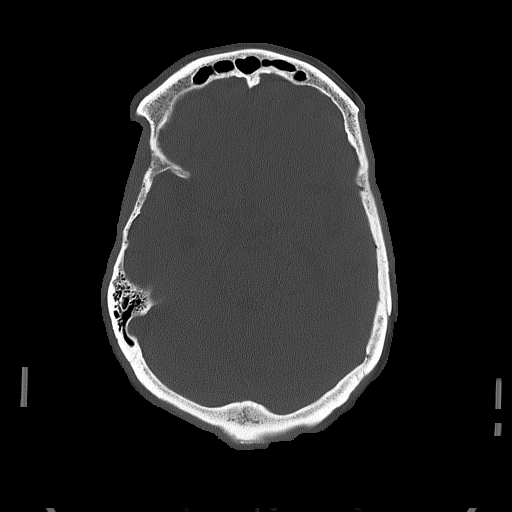

[Series 5: head without cor · coronal · non-contrast · 0.31mm/px · 3 of 74 slices shown]
[im 25/74  brain]
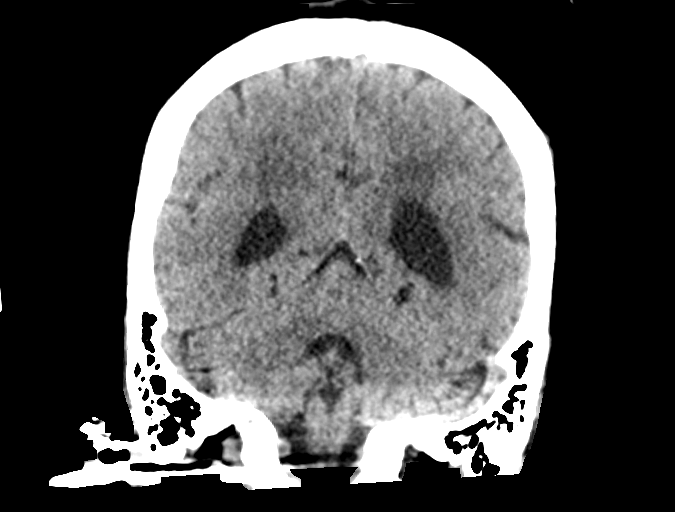
[im 33/74  brain]
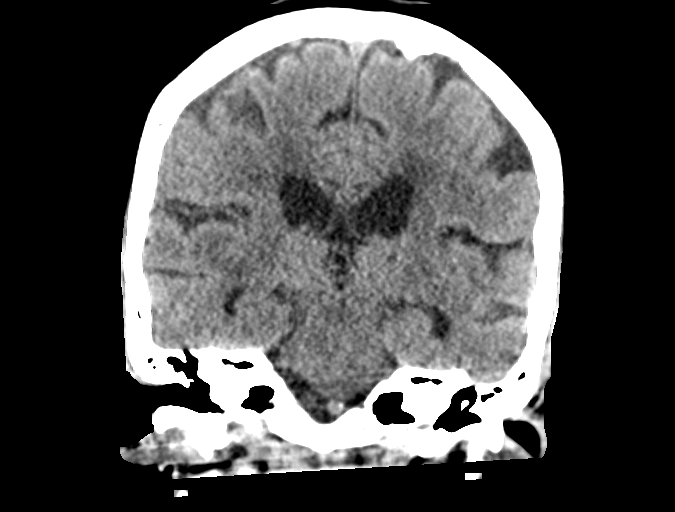
[im 41/74  brain]
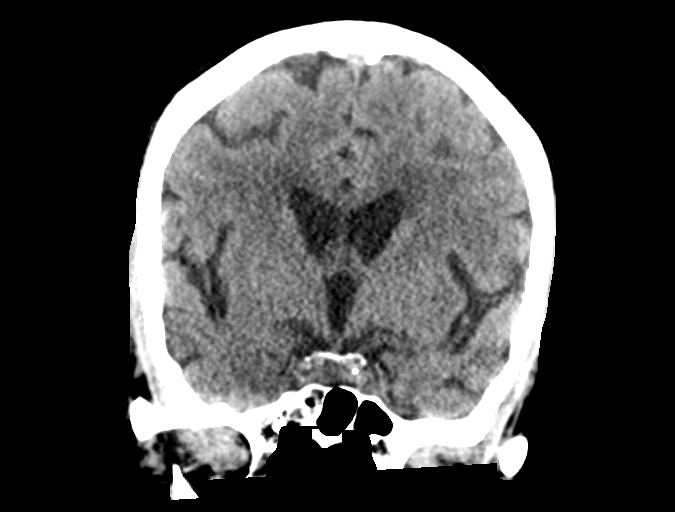

[Series 6: head without sag · sagittal · non-contrast · 0.32mm/px · 3 of 67 slices shown]
[im 23/67  brain]
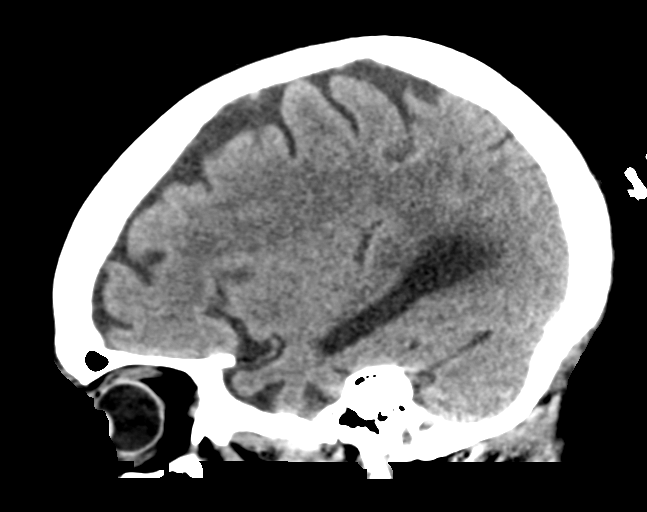
[im 34/67  brain]
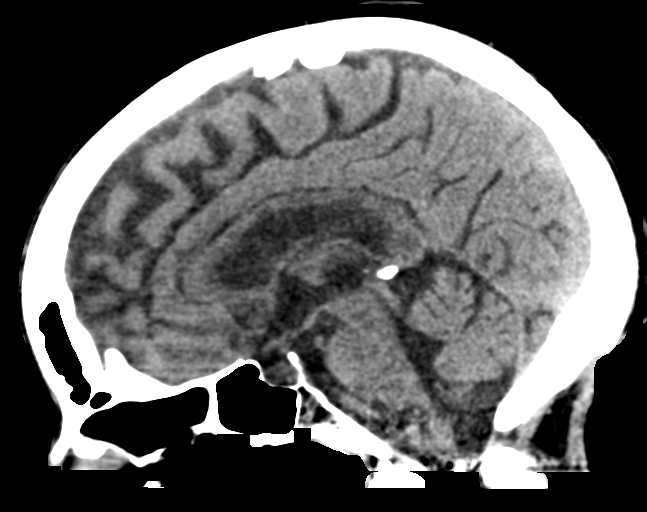
[im 45/67  brain]
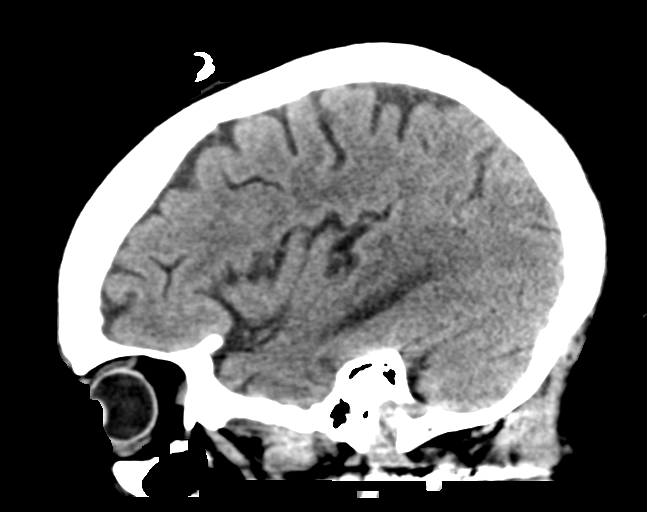

[16 of 47 positions shown; findings below may reference images not displayed]

FINDINGS: Brain: Mild atrophic changes and chronic white matter ischemic
changes are noted. No findings to suggest acute hemorrhage, acute
infarction or space-occupying mass lesion are noted.

Vascular: No hyperdense vessel or unexpected calcification.

Skull: Normal. Negative for fracture or focal lesion.

Sinuses/Orbits: No acute finding.

Other: None.
IMPRESSION: Chronic atrophic and ischemic changes similar to that seen on the
prior exam. No acute abnormality noted.

## 2022-02-19 DIAGNOSIS — D649 Anemia, unspecified: Secondary | ICD-10-CM | POA: Diagnosis not present

## 2022-02-25 DIAGNOSIS — I10 Essential (primary) hypertension: Secondary | ICD-10-CM | POA: Diagnosis not present

## 2022-02-25 DIAGNOSIS — R5383 Other fatigue: Secondary | ICD-10-CM | POA: Diagnosis not present

## 2022-02-25 DIAGNOSIS — R531 Weakness: Secondary | ICD-10-CM | POA: Diagnosis not present

## 2022-02-25 DIAGNOSIS — D631 Anemia in chronic kidney disease: Secondary | ICD-10-CM | POA: Diagnosis not present

## 2022-02-25 DIAGNOSIS — E162 Hypoglycemia, unspecified: Secondary | ICD-10-CM | POA: Diagnosis not present

## 2022-02-25 DIAGNOSIS — E1122 Type 2 diabetes mellitus with diabetic chronic kidney disease: Secondary | ICD-10-CM | POA: Diagnosis not present

## 2022-02-25 DIAGNOSIS — N184 Chronic kidney disease, stage 4 (severe): Secondary | ICD-10-CM | POA: Diagnosis not present

## 2022-02-25 DIAGNOSIS — R601 Generalized edema: Secondary | ICD-10-CM | POA: Diagnosis not present

## 2022-02-25 DIAGNOSIS — Z743 Need for continuous supervision: Secondary | ICD-10-CM | POA: Diagnosis not present

## 2022-02-25 DIAGNOSIS — I129 Hypertensive chronic kidney disease with stage 1 through stage 4 chronic kidney disease, or unspecified chronic kidney disease: Secondary | ICD-10-CM | POA: Diagnosis not present

## 2022-02-25 DIAGNOSIS — B961 Klebsiella pneumoniae [K. pneumoniae] as the cause of diseases classified elsewhere: Secondary | ICD-10-CM | POA: Diagnosis not present

## 2022-02-25 DIAGNOSIS — A419 Sepsis, unspecified organism: Secondary | ICD-10-CM | POA: Diagnosis not present

## 2022-02-25 DIAGNOSIS — N390 Urinary tract infection, site not specified: Secondary | ICD-10-CM | POA: Diagnosis not present

## 2022-02-25 DIAGNOSIS — F0150 Vascular dementia without behavioral disturbance: Secondary | ICD-10-CM | POA: Diagnosis not present

## 2022-02-25 DIAGNOSIS — Z794 Long term (current) use of insulin: Secondary | ICD-10-CM | POA: Diagnosis not present

## 2022-02-25 DIAGNOSIS — Z8616 Personal history of COVID-19: Secondary | ICD-10-CM | POA: Diagnosis not present

## 2022-02-25 DIAGNOSIS — R4182 Altered mental status, unspecified: Secondary | ICD-10-CM | POA: Diagnosis not present

## 2022-02-25 DIAGNOSIS — G9341 Metabolic encephalopathy: Secondary | ICD-10-CM | POA: Diagnosis not present

## 2022-02-25 DIAGNOSIS — D649 Anemia, unspecified: Secondary | ICD-10-CM | POA: Diagnosis not present

## 2022-02-25 DIAGNOSIS — J90 Pleural effusion, not elsewhere classified: Secondary | ICD-10-CM | POA: Diagnosis not present

## 2022-02-25 DIAGNOSIS — J9811 Atelectasis: Secondary | ICD-10-CM | POA: Diagnosis not present

## 2022-02-25 DIAGNOSIS — R6521 Severe sepsis with septic shock: Secondary | ICD-10-CM | POA: Diagnosis not present

## 2022-02-25 DIAGNOSIS — E11649 Type 2 diabetes mellitus with hypoglycemia without coma: Secondary | ICD-10-CM | POA: Diagnosis not present

## 2022-02-25 DIAGNOSIS — N179 Acute kidney failure, unspecified: Secondary | ICD-10-CM | POA: Diagnosis not present

## 2022-03-02 DIAGNOSIS — Z992 Dependence on renal dialysis: Secondary | ICD-10-CM | POA: Diagnosis not present

## 2022-03-02 DIAGNOSIS — N390 Urinary tract infection, site not specified: Secondary | ICD-10-CM | POA: Diagnosis not present

## 2022-03-02 DIAGNOSIS — E46 Unspecified protein-calorie malnutrition: Secondary | ICD-10-CM | POA: Diagnosis not present

## 2022-03-02 DIAGNOSIS — N185 Chronic kidney disease, stage 5: Secondary | ICD-10-CM | POA: Diagnosis not present

## 2022-03-02 DIAGNOSIS — E1122 Type 2 diabetes mellitus with diabetic chronic kidney disease: Secondary | ICD-10-CM | POA: Diagnosis not present

## 2022-03-02 DIAGNOSIS — Z794 Long term (current) use of insulin: Secondary | ICD-10-CM | POA: Diagnosis not present

## 2022-03-02 DIAGNOSIS — Z6822 Body mass index (BMI) 22.0-22.9, adult: Secondary | ICD-10-CM | POA: Diagnosis not present

## 2022-03-04 DIAGNOSIS — R601 Generalized edema: Secondary | ICD-10-CM | POA: Diagnosis not present

## 2022-03-04 DIAGNOSIS — N184 Chronic kidney disease, stage 4 (severe): Secondary | ICD-10-CM | POA: Diagnosis not present

## 2022-03-04 DIAGNOSIS — I10 Essential (primary) hypertension: Secondary | ICD-10-CM | POA: Diagnosis not present

## 2022-03-04 DIAGNOSIS — R4182 Altered mental status, unspecified: Secondary | ICD-10-CM | POA: Diagnosis not present

## 2022-03-05 DIAGNOSIS — R748 Abnormal levels of other serum enzymes: Secondary | ICD-10-CM | POA: Diagnosis not present

## 2022-03-05 DIAGNOSIS — I129 Hypertensive chronic kidney disease with stage 1 through stage 4 chronic kidney disease, or unspecified chronic kidney disease: Secondary | ICD-10-CM | POA: Diagnosis not present

## 2022-03-05 DIAGNOSIS — N189 Chronic kidney disease, unspecified: Secondary | ICD-10-CM | POA: Diagnosis not present

## 2022-03-05 DIAGNOSIS — N179 Acute kidney failure, unspecified: Secondary | ICD-10-CM | POA: Diagnosis not present

## 2022-03-06 DIAGNOSIS — D649 Anemia, unspecified: Secondary | ICD-10-CM | POA: Diagnosis not present

## 2022-03-09 DIAGNOSIS — D649 Anemia, unspecified: Secondary | ICD-10-CM | POA: Diagnosis not present

## 2022-03-10 DIAGNOSIS — I129 Hypertensive chronic kidney disease with stage 1 through stage 4 chronic kidney disease, or unspecified chronic kidney disease: Secondary | ICD-10-CM | POA: Diagnosis not present

## 2022-03-10 DIAGNOSIS — I5030 Unspecified diastolic (congestive) heart failure: Secondary | ICD-10-CM | POA: Diagnosis not present

## 2022-03-10 DIAGNOSIS — N179 Acute kidney failure, unspecified: Secondary | ICD-10-CM | POA: Diagnosis not present

## 2022-03-10 DIAGNOSIS — J90 Pleural effusion, not elsewhere classified: Secondary | ICD-10-CM | POA: Diagnosis not present

## 2022-03-10 DIAGNOSIS — F0150 Vascular dementia without behavioral disturbance: Secondary | ICD-10-CM | POA: Diagnosis not present

## 2022-03-10 DIAGNOSIS — N184 Chronic kidney disease, stage 4 (severe): Secondary | ICD-10-CM | POA: Diagnosis not present

## 2022-03-10 DIAGNOSIS — R4182 Altered mental status, unspecified: Secondary | ICD-10-CM | POA: Diagnosis not present

## 2022-03-10 DIAGNOSIS — R339 Retention of urine, unspecified: Secondary | ICD-10-CM | POA: Diagnosis not present

## 2022-03-10 DIAGNOSIS — E1122 Type 2 diabetes mellitus with diabetic chronic kidney disease: Secondary | ICD-10-CM | POA: Diagnosis not present

## 2022-03-10 DIAGNOSIS — D6489 Other specified anemias: Secondary | ICD-10-CM | POA: Diagnosis not present

## 2022-03-10 DIAGNOSIS — F39 Unspecified mood [affective] disorder: Secondary | ICD-10-CM | POA: Diagnosis not present

## 2022-03-10 DIAGNOSIS — E875 Hyperkalemia: Secondary | ICD-10-CM | POA: Diagnosis not present

## 2022-03-10 DIAGNOSIS — I361 Nonrheumatic tricuspid (valve) insufficiency: Secondary | ICD-10-CM | POA: Diagnosis not present

## 2022-03-10 DIAGNOSIS — J9811 Atelectasis: Secondary | ICD-10-CM | POA: Diagnosis not present

## 2022-03-10 DIAGNOSIS — F0153 Vascular dementia, unspecified severity, with mood disturbance: Secondary | ICD-10-CM | POA: Diagnosis not present

## 2022-03-10 DIAGNOSIS — M7989 Other specified soft tissue disorders: Secondary | ICD-10-CM | POA: Diagnosis not present

## 2022-03-10 DIAGNOSIS — I120 Hypertensive chronic kidney disease with stage 5 chronic kidney disease or end stage renal disease: Secondary | ICD-10-CM | POA: Diagnosis not present

## 2022-03-10 DIAGNOSIS — R2689 Other abnormalities of gait and mobility: Secondary | ICD-10-CM | POA: Diagnosis not present

## 2022-03-10 DIAGNOSIS — R7989 Other specified abnormal findings of blood chemistry: Secondary | ICD-10-CM | POA: Diagnosis not present

## 2022-03-10 DIAGNOSIS — I82611 Acute embolism and thrombosis of superficial veins of right upper extremity: Secondary | ICD-10-CM | POA: Diagnosis not present

## 2022-03-10 DIAGNOSIS — D649 Anemia, unspecified: Secondary | ICD-10-CM | POA: Diagnosis not present

## 2022-03-10 DIAGNOSIS — N289 Disorder of kidney and ureter, unspecified: Secondary | ICD-10-CM | POA: Diagnosis not present

## 2022-03-10 DIAGNOSIS — R601 Generalized edema: Secondary | ICD-10-CM | POA: Diagnosis not present

## 2022-03-10 DIAGNOSIS — I517 Cardiomegaly: Secondary | ICD-10-CM | POA: Diagnosis not present

## 2022-03-10 DIAGNOSIS — J918 Pleural effusion in other conditions classified elsewhere: Secondary | ICD-10-CM | POA: Diagnosis not present

## 2022-03-10 DIAGNOSIS — E872 Acidosis, unspecified: Secondary | ICD-10-CM | POA: Diagnosis not present

## 2022-03-10 DIAGNOSIS — I82711 Chronic embolism and thrombosis of superficial veins of right upper extremity: Secondary | ICD-10-CM | POA: Diagnosis not present

## 2022-03-10 DIAGNOSIS — I132 Hypertensive heart and chronic kidney disease with heart failure and with stage 5 chronic kidney disease, or end stage renal disease: Secondary | ICD-10-CM | POA: Diagnosis not present

## 2022-03-10 DIAGNOSIS — N185 Chronic kidney disease, stage 5: Secondary | ICD-10-CM | POA: Diagnosis not present

## 2022-03-10 DIAGNOSIS — R69 Illness, unspecified: Secondary | ICD-10-CM | POA: Diagnosis not present

## 2022-03-10 DIAGNOSIS — R799 Abnormal finding of blood chemistry, unspecified: Secondary | ICD-10-CM | POA: Diagnosis not present

## 2022-03-10 DIAGNOSIS — D631 Anemia in chronic kidney disease: Secondary | ICD-10-CM | POA: Diagnosis not present

## 2022-03-10 DIAGNOSIS — R0902 Hypoxemia: Secondary | ICD-10-CM | POA: Diagnosis not present

## 2022-03-10 DIAGNOSIS — F17210 Nicotine dependence, cigarettes, uncomplicated: Secondary | ICD-10-CM | POA: Diagnosis not present

## 2022-03-19 DIAGNOSIS — E213 Hyperparathyroidism, unspecified: Secondary | ICD-10-CM | POA: Diagnosis not present

## 2022-03-19 DIAGNOSIS — N2581 Secondary hyperparathyroidism of renal origin: Secondary | ICD-10-CM | POA: Diagnosis not present

## 2022-03-19 DIAGNOSIS — N185 Chronic kidney disease, stage 5: Secondary | ICD-10-CM | POA: Diagnosis not present

## 2022-03-19 DIAGNOSIS — I679 Cerebrovascular disease, unspecified: Secondary | ICD-10-CM | POA: Diagnosis not present

## 2022-03-19 DIAGNOSIS — F17210 Nicotine dependence, cigarettes, uncomplicated: Secondary | ICD-10-CM | POA: Diagnosis not present

## 2022-03-19 DIAGNOSIS — Z6825 Body mass index (BMI) 25.0-25.9, adult: Secondary | ICD-10-CM | POA: Diagnosis not present

## 2022-03-19 DIAGNOSIS — Z794 Long term (current) use of insulin: Secondary | ICD-10-CM | POA: Diagnosis not present

## 2022-03-19 DIAGNOSIS — D631 Anemia in chronic kidney disease: Secondary | ICD-10-CM | POA: Diagnosis not present

## 2022-03-19 DIAGNOSIS — N170 Acute kidney failure with tubular necrosis: Secondary | ICD-10-CM | POA: Diagnosis not present

## 2022-03-19 DIAGNOSIS — E1122 Type 2 diabetes mellitus with diabetic chronic kidney disease: Secondary | ICD-10-CM | POA: Diagnosis not present

## 2022-03-19 DIAGNOSIS — E46 Unspecified protein-calorie malnutrition: Secondary | ICD-10-CM | POA: Diagnosis not present

## 2022-03-19 DIAGNOSIS — Z515 Encounter for palliative care: Secondary | ICD-10-CM | POA: Diagnosis not present

## 2022-03-19 DIAGNOSIS — R4189 Other symptoms and signs involving cognitive functions and awareness: Secondary | ICD-10-CM | POA: Diagnosis not present

## 2022-03-23 DIAGNOSIS — N185 Chronic kidney disease, stage 5: Secondary | ICD-10-CM | POA: Diagnosis not present

## 2022-03-23 DIAGNOSIS — E1142 Type 2 diabetes mellitus with diabetic polyneuropathy: Secondary | ICD-10-CM | POA: Diagnosis not present

## 2022-03-23 DIAGNOSIS — F3341 Major depressive disorder, recurrent, in partial remission: Secondary | ICD-10-CM | POA: Diagnosis not present

## 2022-03-23 DIAGNOSIS — E46 Unspecified protein-calorie malnutrition: Secondary | ICD-10-CM | POA: Diagnosis not present

## 2022-03-23 DIAGNOSIS — I120 Hypertensive chronic kidney disease with stage 5 chronic kidney disease or end stage renal disease: Secondary | ICD-10-CM | POA: Diagnosis not present

## 2022-03-23 DIAGNOSIS — D631 Anemia in chronic kidney disease: Secondary | ICD-10-CM | POA: Diagnosis not present

## 2022-03-23 DIAGNOSIS — E1122 Type 2 diabetes mellitus with diabetic chronic kidney disease: Secondary | ICD-10-CM | POA: Diagnosis not present

## 2022-03-23 DIAGNOSIS — Z6825 Body mass index (BMI) 25.0-25.9, adult: Secondary | ICD-10-CM | POA: Diagnosis not present

## 2022-03-23 DIAGNOSIS — Z7984 Long term (current) use of oral hypoglycemic drugs: Secondary | ICD-10-CM | POA: Diagnosis not present

## 2022-03-24 DIAGNOSIS — E210 Primary hyperparathyroidism: Secondary | ICD-10-CM | POA: Diagnosis not present

## 2022-03-24 DIAGNOSIS — F5101 Primary insomnia: Secondary | ICD-10-CM | POA: Diagnosis not present

## 2022-03-24 DIAGNOSIS — Z79899 Other long term (current) drug therapy: Secondary | ICD-10-CM | POA: Diagnosis not present

## 2022-03-24 DIAGNOSIS — D649 Anemia, unspecified: Secondary | ICD-10-CM | POA: Diagnosis not present

## 2022-03-24 DIAGNOSIS — F32A Depression, unspecified: Secondary | ICD-10-CM | POA: Diagnosis not present

## 2022-03-24 DIAGNOSIS — E559 Vitamin D deficiency, unspecified: Secondary | ICD-10-CM | POA: Diagnosis not present

## 2022-03-26 DIAGNOSIS — E1122 Type 2 diabetes mellitus with diabetic chronic kidney disease: Secondary | ICD-10-CM | POA: Diagnosis not present

## 2022-03-26 DIAGNOSIS — N186 End stage renal disease: Secondary | ICD-10-CM | POA: Diagnosis not present

## 2022-03-26 DIAGNOSIS — E559 Vitamin D deficiency, unspecified: Secondary | ICD-10-CM | POA: Diagnosis not present

## 2022-03-26 DIAGNOSIS — E1165 Type 2 diabetes mellitus with hyperglycemia: Secondary | ICD-10-CM | POA: Diagnosis not present

## 2022-03-26 DIAGNOSIS — I129 Hypertensive chronic kidney disease with stage 1 through stage 4 chronic kidney disease, or unspecified chronic kidney disease: Secondary | ICD-10-CM | POA: Diagnosis not present

## 2022-03-27 DIAGNOSIS — N185 Chronic kidney disease, stage 5: Secondary | ICD-10-CM | POA: Diagnosis not present

## 2022-03-27 DIAGNOSIS — I129 Hypertensive chronic kidney disease with stage 1 through stage 4 chronic kidney disease, or unspecified chronic kidney disease: Secondary | ICD-10-CM | POA: Diagnosis not present

## 2022-03-27 DIAGNOSIS — E1122 Type 2 diabetes mellitus with diabetic chronic kidney disease: Secondary | ICD-10-CM | POA: Diagnosis not present

## 2022-03-27 DIAGNOSIS — E1165 Type 2 diabetes mellitus with hyperglycemia: Secondary | ICD-10-CM | POA: Diagnosis not present

## 2022-05-20 DEATH — deceased
